# Patient Record
Sex: Female | Born: 1985 | Race: Black or African American | Hispanic: No | Marital: Single | Smoking: Never smoker
Health system: Southern US, Community
[De-identification: ages and names within clinical notes are randomized; demographics above are authoritative.]

## PROBLEM LIST (undated history)

## (undated) DIAGNOSIS — F32A Depression, unspecified: Secondary | ICD-10-CM

## (undated) DIAGNOSIS — G932 Benign intracranial hypertension: Secondary | ICD-10-CM

## (undated) DIAGNOSIS — F419 Anxiety disorder, unspecified: Secondary | ICD-10-CM

## (undated) DIAGNOSIS — T7840XA Allergy, unspecified, initial encounter: Secondary | ICD-10-CM

## (undated) HISTORY — DX: Allergy, unspecified, initial encounter: T78.40XA

## (undated) HISTORY — DX: Anxiety disorder, unspecified: F41.9

## (undated) HISTORY — PX: CHOLECYSTECTOMY: SHX55

## (undated) HISTORY — DX: Depression, unspecified: F32.A

---

## 2020-02-26 ENCOUNTER — Emergency Department (HOSPITAL_COMMUNITY): Payer: PRIVATE HEALTH INSURANCE

## 2020-02-26 ENCOUNTER — Emergency Department (HOSPITAL_COMMUNITY)
Admission: EM | Admit: 2020-02-26 | Discharge: 2020-02-27 | Disposition: A | Discharge status: Home / Self Care | Payer: PRIVATE HEALTH INSURANCE | Attending: Emergency Medicine | Admitting: Emergency Medicine

## 2020-02-26 ENCOUNTER — Encounter (HOSPITAL_COMMUNITY): Payer: Self-pay

## 2020-02-26 DIAGNOSIS — R21 Rash and other nonspecific skin eruption: Secondary | ICD-10-CM | POA: Insufficient documentation

## 2020-02-26 DIAGNOSIS — L539 Erythematous condition, unspecified: Secondary | ICD-10-CM | POA: Insufficient documentation

## 2020-02-26 DIAGNOSIS — Z20822 Contact with and (suspected) exposure to covid-19: Secondary | ICD-10-CM | POA: Insufficient documentation

## 2020-02-26 DIAGNOSIS — M308 Other conditions related to polyarteritis nodosa: Secondary | ICD-10-CM | POA: Insufficient documentation

## 2020-02-26 DIAGNOSIS — M2550 Pain in unspecified joint: Secondary | ICD-10-CM

## 2020-02-26 DIAGNOSIS — R509 Fever, unspecified: Secondary | ICD-10-CM | POA: Insufficient documentation

## 2020-02-26 LAB — RESPIRATORY PANEL BY RT PCR (FLU A&B, COVID)
Influenza A by PCR: NEGATIVE
Influenza B by PCR: NEGATIVE
SARS Coronavirus 2 by RT PCR: NEGATIVE

## 2020-02-26 LAB — GROUP A STREP BY PCR: Group A Strep by PCR: NOT DETECTED

## 2020-02-26 IMAGING — DX DG CHEST 2V
2 series · 2 of 2 positions shown · non-contrast
Comparison: None.

CLINICAL DATA: Fever and right-sided chest pain. Sore throat. Body
aches. COVID negative and fully vaccinated.

EXAM:
CHEST - 2 VIEW

[chest pa]
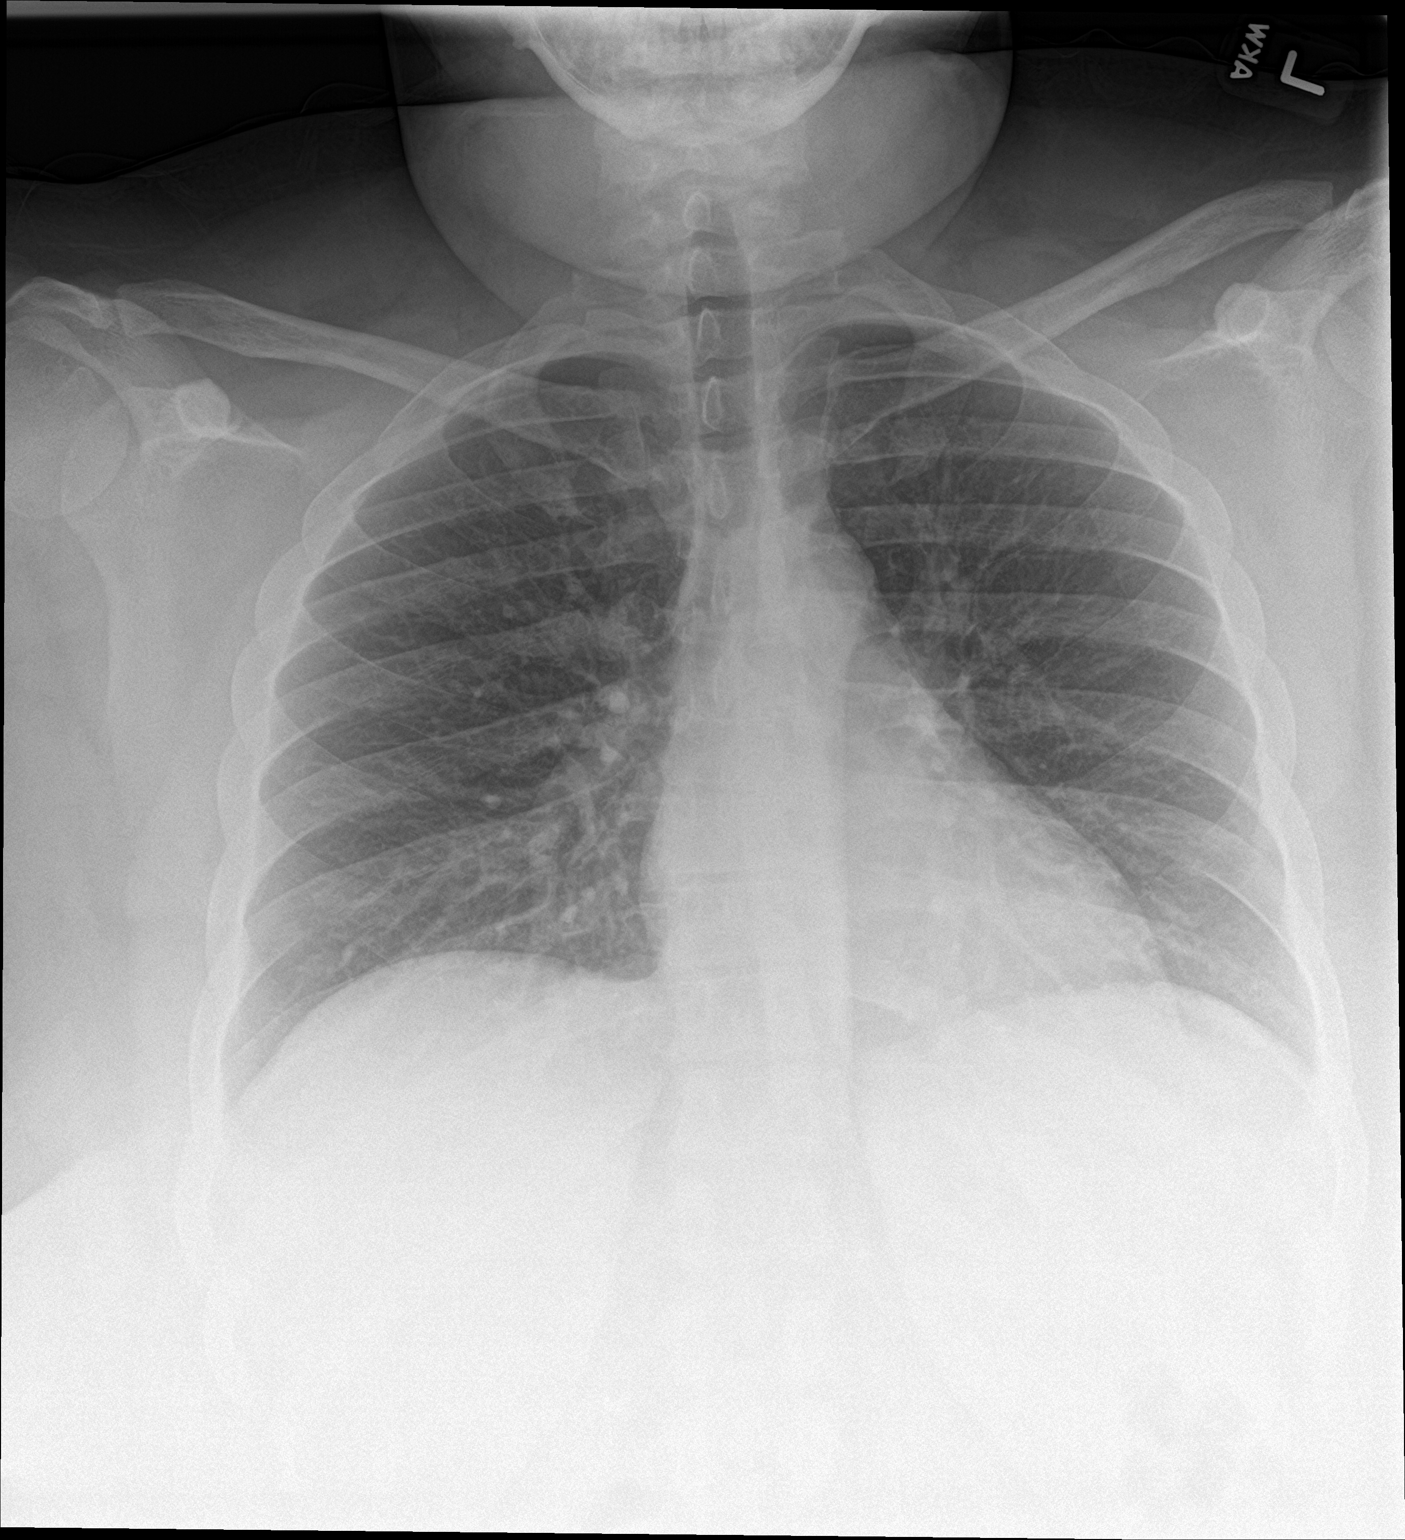

[chest lat]
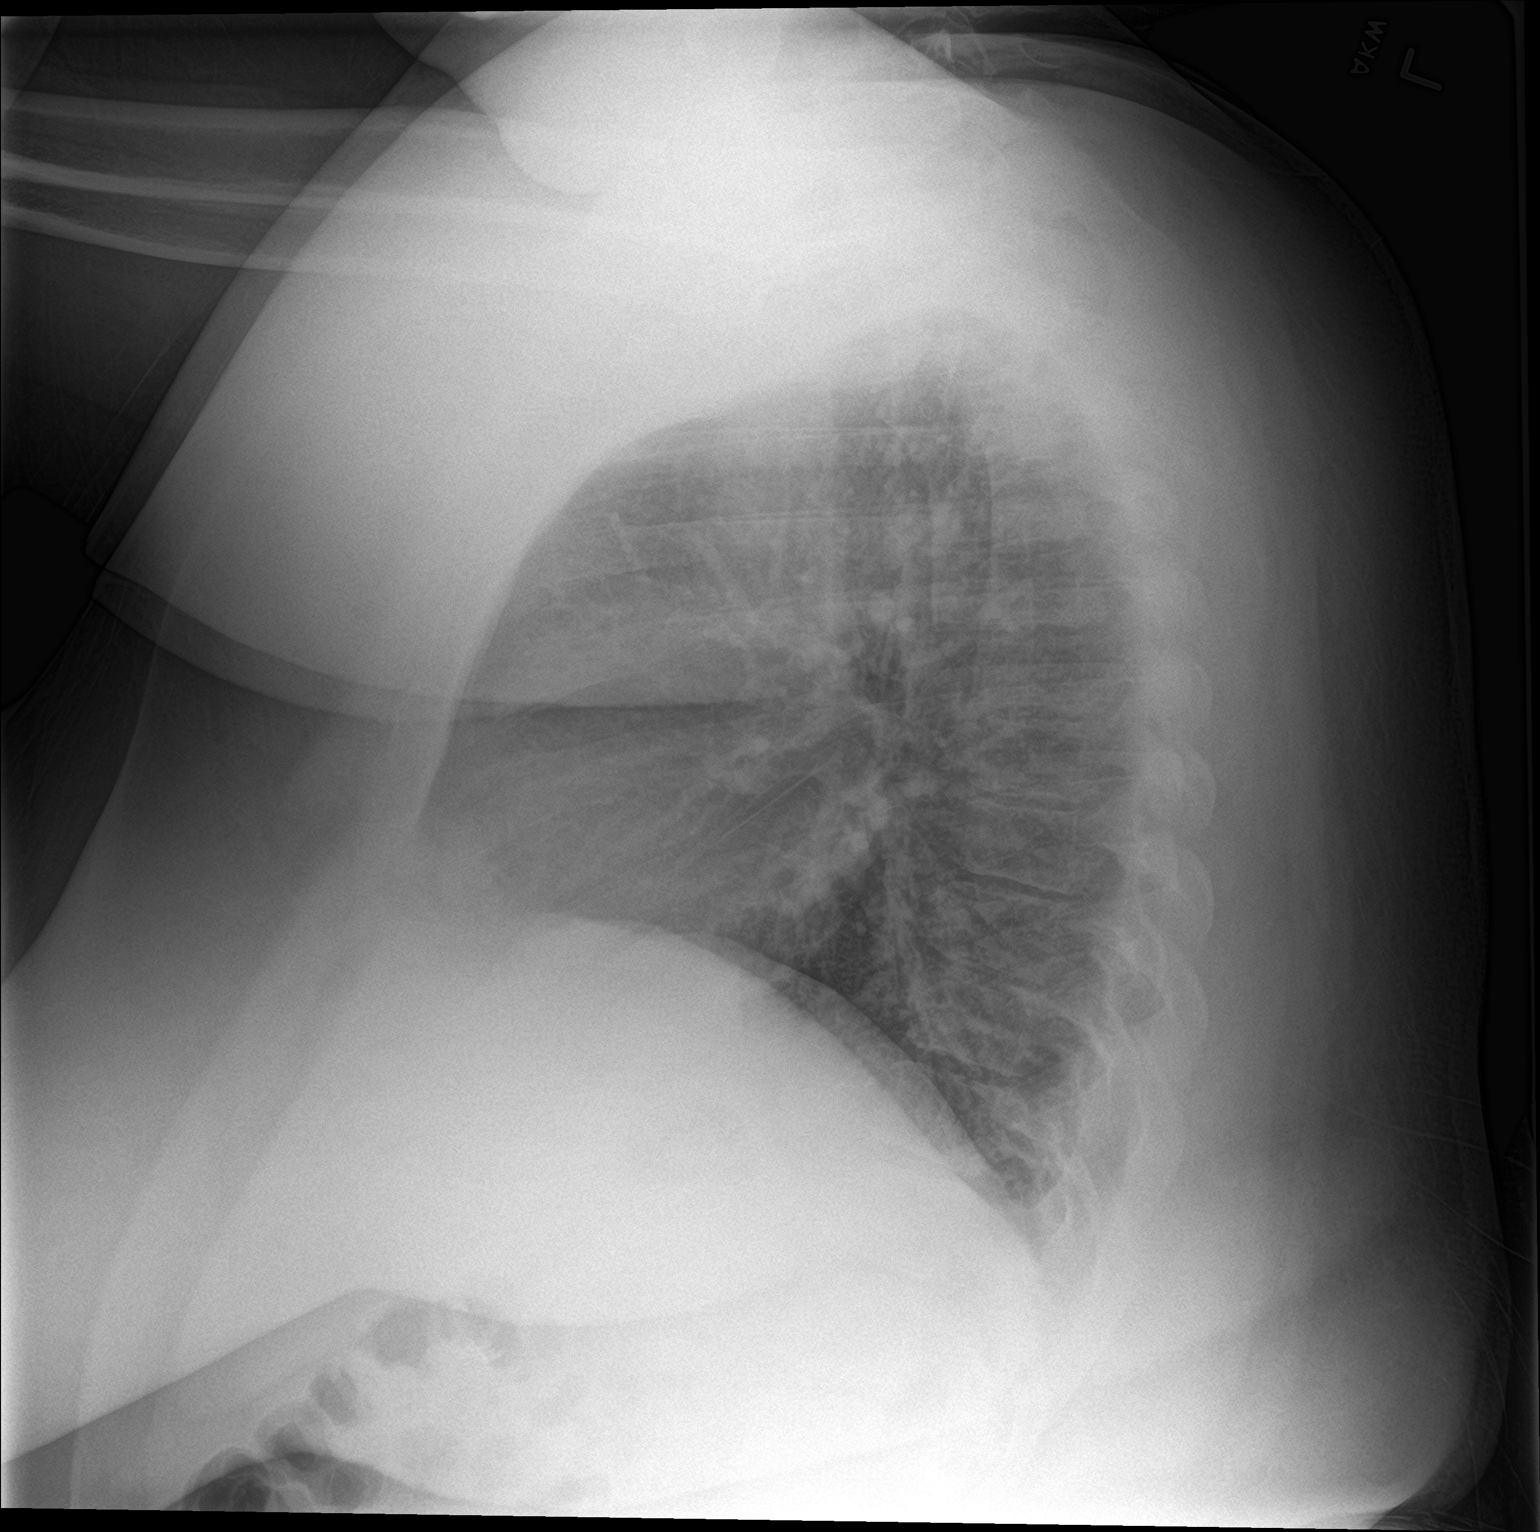

[2 of 2 positions shown; findings below may reference images not displayed]

FINDINGS: The cardiomediastinal contours are normal. Mild bronchial
thickening. Pulmonary vasculature is normal. No consolidation,
pleural effusion, or pneumothorax. No acute osseous abnormalities
are seen.
IMPRESSION: Mild bronchial thickening. No pneumonia.

## 2020-02-26 NOTE — ED Triage Notes (Signed)
Pt reports headache, sore throat, fevers, body aches, for 3 days, pt is vaccinated

## 2020-02-26 NOTE — ED Provider Notes (Signed)
[facility] EMERGENCY DEPARTMENT Provider Note   CSN: [id number] Arrival date & time: 02/26/20  6767     History No chief complaint on file.   Crystal Melton is a 34 y.o. female with a history of recurrent tonsillitis and rosacea who presents to the emergency department with a chief complaint of polyarthralgias.  The patient reports that she developed an intermittent posterior headache approximately 4 to 5 days ago.  Headache has been throbbing and coming and going since onset.  Does not radiate.  No history of similar.  Approximately 3 days ago, she developed severe pain in her bilateral elbows and shoulders followed by bilateral ear pain and pressure.  She reports that the ear pain and pressure felt similar to previous infections and usually preceded a sore throat, which is what happened in this instance.  However, she does not has a history of previous arthralgias.  She reports that sore throat has persisted since onset and she has been having worsening dysphagia.  States that she feels as if she can barely swallow and has had some intermittent drooling.  Sore throat is worse on the right side.  After her sore throat began to worsen, she reports that joint pain spread from her elbows and shoulders to her bilateral hips, knees, and said tops of her bilateral feet.  She is also having some intermittent pain in the small joints of her bilateral hands.  She has had intermittent numbness in her bilateral hands.  No known aggravating or alleviating factors.  She denies weakness.  As the arthralgias spread, she also developed an erythematous rash on her hands that spread up her arms into her trunk.  She does not recall that she had a rash on her face, but did not feel as if her face was swollen and more edematous than when she typically has an outbreak from rosacea.  She also noticed some itching in the corner of her bilateral eyes, which is since resolved.  She thought this may be  due to taking Mucinex, but reports that she has never had an allergic reaction to this medication.  However, she since discontinued the medication.  Two nights ago, she developed a fever.  T-max 103 at home earlier today.  She denies any treatment for her fever over the last 24 hours.  After onset of fever, she developed some achy, constant, right-sided, nonradiating chest pain.    She reports that as her joint pain is worse and that she can barely lift her shoulders and is unable to fully range her elbows.  She reports that it took her approximately 30 minutes to walk up the stairs today due to the pain in the joints in her leg.  She reports that she is also having significant pain and stiffness with moving her neck and feels as if she has to turn her body.  She denies confusion, abdominal pain, nausea, vomiting, diarrhea, back pain, shortness of breath, cough, nasal congestion, rhinorrhea, otalgia, vaginal bleeding, vaginal discharge, dysuria, hematuria, flank pain, tinnitus, dizziness, lightheadedness.  She has a history of recurrent tonsillitis and has been tested multiple times for streptococcal pharyngitis, but reports that she has never tested positive.  She reports that she has spoken with ENT about having a tonsillectomy, but has yet to schedule the procedure.  She is fully vaccinated against COVID-19.  No known sick contacts.  No recent travel outside of the country.  No recent camping or beach trips.  No known tick bites  or mosquito bites.  No new contact with animals.  She does not have any concerns for STIs, but is sexually active.  She has no concerns for pregnancy.  Patient recently relocated to the area from [place].  The history is provided by the patient and medical records. No language interpreter was used.       History reviewed. No pertinent past medical history.  There are no problems to display for this patient.   Past Surgical History:  Procedure Laterality Date  .  CHOLECYSTECTOMY       OB History   No obstetric history on file.     No family history on file.  Social History   Tobacco Use  . Smoking status: Not on file  Substance Use Topics  . Alcohol use: Not on file  . Drug use: Not on file    Home Medications Prior to Admission medications   Medication Sig Start Date End Date Taking? Authorizing Provider  guaiFENesin (MUCINEX PO) Take 20 mLs by mouth daily as needed (For cold symptoms).    Yes [provider]  ibuprofen (ADVIL) 200 MG tablet Take 200 mg by mouth every 6 (six) hours as needed for moderate pain.   Yes [provider]  doxycycline (VIBRAMYCIN) 50 MG/5ML SYRP Take 10 mLs (100 mg total) by mouth 2 (two) times daily for 7 days. 02/27/20 03/05/20  Judge Duque A, PA-C    Allergies    Penicillins  Review of Systems   Review of Systems  Constitutional: Positive for chills and fever. Negative for activity change and diaphoresis.  HENT: Positive for ear pain, facial swelling, sore throat and trouble swallowing. Negative for congestion, rhinorrhea, sinus pressure, sinus pain, tinnitus and voice change.   Eyes: Negative for visual disturbance.  Respiratory: Negative for cough, shortness of breath and wheezing.   Cardiovascular: Positive for chest pain. Negative for palpitations.  Gastrointestinal: Negative for abdominal pain, constipation, diarrhea, nausea and vomiting.  Endocrine: Negative for polyuria.  Genitourinary: Negative for decreased urine volume, dysuria, flank pain, frequency, hematuria, urgency and vaginal discharge.  Musculoskeletal: Positive for arthralgias, gait problem, myalgias, neck pain and neck stiffness. Negative for back pain and joint swelling.  Skin: Positive for rash. Negative for wound.  Allergic/Immunologic: Negative for immunocompromised state.  Neurological: Positive for numbness (bilateral hands) and headaches. Negative for dizziness, syncope and weakness.   Psychiatric/Behavioral: Negative for confusion.    Physical Exam Updated Vital Signs BP 112/75 (BP Location: Left Wrist)   Pulse 94   Temp 98.6 F (37 C) (Oral)   Resp 20   SpO2 96%   Physical Exam Vitals and nursing note reviewed.  Constitutional:      General: She is not in acute distress.    Appearance: She is not ill-appearing, toxic-appearing or diaphoretic.  HENT:     Head: Normocephalic.     Right Ear: Hearing, ear canal and external ear normal. A middle ear effusion is present. No mastoid tenderness. Tympanic membrane is not injected, erythematous, retracted or bulging.     Left Ear: Hearing, ear canal and external ear normal. A middle ear effusion is present. No mastoid tenderness. Tympanic membrane is not injected, erythematous, retracted or bulging.     Nose: Nose normal.     Right Sinus: No maxillary sinus tenderness or frontal sinus tenderness.     Left Sinus: No maxillary sinus tenderness or frontal sinus tenderness.     Mouth/Throat:     Mouth: Mucous membranes are moist.  Pharynx: Oropharynx is clear. Uvula midline. Posterior oropharyngeal erythema present. No oropharyngeal exudate or uvula swelling.     Tonsils: No tonsillar exudate or tonsillar abscesses. 3+ on the right. 3+ on the left.  Eyes:     Conjunctiva/sclera: Conjunctivae normal.  Cardiovascular:     Rate and Rhythm: Normal rate and regular rhythm.     Pulses: Normal pulses.     Heart sounds: Normal heart sounds. No murmur heard.  No friction rub. No gallop.   Pulmonary:     Effort: Pulmonary effort is normal. No respiratory distress.     Breath sounds: No stridor. No wheezing, rhonchi or rales.     Comments: Lungs are clear to auscultation bilaterally.  No increased work of breathing.  No reproducible tenderness to palpation to the chest wall.  No crepitus or step-offs. Chest:     Chest wall: No tenderness.  Abdominal:     General: There is no distension.     Palpations: Abdomen is soft.  There is no mass.     Tenderness: There is no abdominal tenderness. There is no right CVA tenderness, left CVA tenderness, guarding or rebound.     Hernia: No hernia is present.  Musculoskeletal:     Cervical back: Neck supple.     Right lower leg: No edema.     Left lower leg: No edema.     Comments: No tenderness to palpation to the spinous processes of the cervical, thoracic, or lumbar spinous processes or bilateral paraspinal muscles.  Decreased range of motion of the cervical spine due to pain.   She is tender to palpation to the bilateral shoulders, elbows, hips, knees, and to the dorsum of the bilateral feet.  Bilateral ankles are otherwise unremarkable.  Bilateral wrist are nontender.  She is only able to AB duct her bilateral shoulders to approximately 90 degrees secondary to pain.  Range of motion of the bilateral elbows is also decreased secondary to pain.    There is no overlying erythema, edema to any of the joints.  Skin:    General: Skin is warm.     Capillary Refill: Capillary refill takes less than 2 seconds.     Findings: Rash present.     Comments: Erythematous maculopapular rash noted to the bilateral upper arms.  No pustules, vesicles, petechiae, or purpura.  Exam is limited as patient is in a hallway bed.  Neurological:     Mental Status: She is alert.  Psychiatric:        Behavior: Behavior normal.     ED Results / Procedures / Treatments   Labs (all labs ordered are listed, but only abnormal results are displayed) Labs Reviewed  CBC WITH DIFFERENTIAL/PLATELET - Abnormal; Notable for the following components:      Result Value   WBC 14.4 (*)    Hemoglobin 11.7 (*)    Neutro Abs 10.6 (*)    Monocytes Absolute 1.1 (*)    All other components within normal limits  COMPREHENSIVE METABOLIC PANEL - Abnormal; Notable for the following components:   Potassium 3.3 (*)    Glucose, Bld 102 (*)    BUN <5 (*)    All other components within normal limits   C-REACTIVE PROTEIN - Abnormal; Notable for the following components:   CRP 13.3 (*)    All other components within normal limits  SEDIMENTATION RATE - Abnormal; Notable for the following components:   Sed Rate 38 (*)    All other components within normal  limits  RESPIRATORY PANEL BY RT PCR (FLU A&B, COVID)  GROUP A STREP BY PCR  RESPIRATORY PANEL BY PCR  CULTURE, BLOOD (ROUTINE X 2)  CULTURE, BLOOD (ROUTINE X 2)  MONONUCLEOSIS SCREEN  RAPID HIV SCREEN (HIV 1/2 AB+AG)  CK  RPR  URINALYSIS, ROUTINE W REFLEX MICROSCOPIC  ROCKY MTN SPOTTED FVR ABS PNL(IGG+IGM)  B. BURGDORFI ANTIBODIES    EKG None  Radiology DG Chest 2 View  Result Date: 02/26/2020 CLINICAL DATA:  Fever and right-sided chest pain. Sore throat. Body aches. COVID negative and fully vaccinated. EXAM: CHEST - 2 VIEW COMPARISON:  None. FINDINGS: The cardiomediastinal contours are normal. Mild bronchial thickening. Pulmonary vasculature is normal. No consolidation, pleural effusion, or pneumothorax. No acute osseous abnormalities are seen. IMPRESSION: Mild bronchial thickening. No pneumonia. Electronically Signed   By: Keith Rake M.D.   On: 02/26/2020 23:49    Procedures Procedures (including critical care time)  Medications Ordered in ED Medications  prochlorperazine (COMPAZINE) injection 10 mg (10 mg Intravenous Given 02/27/20 0252)  diphenhydrAMINE (BENADRYL) injection 12.5 mg (12.5 mg Intravenous Given 02/27/20 0253)  ketorolac (TORADOL) 30 MG/ML injection 30 mg (30 mg Intravenous Given 02/27/20 0252)  doxycycline (VIBRAMYCIN) 50 MG/5ML syrup 100 mg (100 mg Oral Given 02/27/20 0434)    ED Course  I have reviewed the triage vital signs and the nursing notes.  Pertinent labs & imaging results that were available during my care of the patient were reviewed by me and considered in my medical decision making (see chart for details).  Clinical Course as of Feb 27 828  Thu Feb 27, 2020  0345 Patient  recheck.  She reports that her symptoms have significantly improved.  Headache has resolved.  She is now able to fully range her cervical spine.  She has no meningismus.  We will plan to fluid challenge the patient with oral doxycycline and discharged home with the same.  Patient and mother are agreeable with plan at this time.   [MM]    Clinical Course User Index [MM] Tabathia Knoche, Laymond Purser, PA-C   MDM Rules/Calculators/A&P                          34 year old female with with a history of recurrent tonsillitis and rosacea presenting with fever, polyarthralgias, rash, headache, right-sided chest pain, bilateral ear pain and pressure, sore throat, and dysphagia that has developed over the last 5 days.   This patient presents to the ED for concern of  this involves an extensive number of treatment options, and is a complaint that carries with it a high risk of complications and morbidity.  The differential diagnosis includes secondary or tertiary syphilis, disseminated gonococcal arthritis, endocarditis, SLE, rheumatic fever, cytomegalovirus, HIV, tickborne illness, leukemia, meningitis, drug reaction, or vasculitis.      Lab Tests:    I ordered, reviewed, and interpreted labs, which included  CBC, metabolic panel, ESR, CRP, blood cultures, viral respiratory panel, [place] spotted fever and Lyme disease PCR test, RPR, HIV test, strep PCR, and COVID-19 test that showed elevated sedimentation rate and CRP.  She had a leukocytosis of 14.  Labs suggest inflammation versus infection.  Loss could be secondary to inflammation given that these are all acute phase reactants.   Medicines ordered:    I ordered Toradol and oral doxycycline.   Imaging Studies ordered:    I ordered imaging studies which included  chest x-ray.  I independently visualized and interpreted imaging which  showed  mild bronchial thickening.  No evidence of pneumonia.  No cardiomegaly, pleural effusion, or pneumothorax.    Additional history obtained:    Previous records obtained and reviewed   Reevaluation:   After the interventions stated above, I reevaluated the patient and found improved.  Significant improvement in range of motion of all joints.  No meningismus.  She is remained afebrile for more than 11 hours since she arrived in the ER.  With 5 days of polyarthralgias, fever, rash, URI symptoms, there is an extensive differential diagnosis.  Although patient does not have any documented history of recent tick bites, she has been working in her garden.  I think it is reasonable to cover for [facility] spotted fever and she has been started on doxycycline in the ER.  I have a lower suspicion for disseminated gonorrhea given her sexual history.  She does not have any risk factors for endocarditis.  Viral respiratory panel was unremarkable.  COVID-19 test was unremarkable.  Patient did initially have headache, but after Toradol this resolved.  She has no meningismus.  I have a low suspicion for meningitis or encephalitis.  Labs were not suggestive of neoplastic etiology.  Lower suspicion for COVID-19.  I also doubt septic joint.  Her presentation does not seem consistent with a drug eruption or reaction.  Blood cultures are pending.  The patient was seen and independently evaluated by Dr. Christy Gentles, attending physician.  She is nontoxic and well-appearing. She is new to the area and a transition of care consult has been placed to help assist the patient with getting established with a PCP.  I do not see any indication for referral to ID at this time.  The patient was given very strict return precautions to the emergency department.  She is hemodynamically stable and in no acute distress.  Safe for discharge to home with close outpatient follow-up at this time.    Final Clinical Impression(s) / ED Diagnoses Final diagnoses:  Polyarthralgia  Fever, unspecified fever cause  Rash and nonspecific skin eruption    Rx  / DC Orders ED Discharge Orders         Ordered    doxycycline (VIBRAMYCIN) 50 MG/5ML SYRP  2 times daily        02/27/20 0513           Joline Maxcy A, PA-C 02/27/20 5183    Ripley Fraise, MD 02/27/20 2324

## 2020-02-26 NOTE — ED Notes (Signed)
Patient transported to X-ray 

## 2020-02-27 LAB — RESPIRATORY PANEL BY PCR

## 2020-02-27 LAB — CBC WITH DIFFERENTIAL/PLATELET
Abs Immature Granulocytes: 0.07 10*3/uL (ref 0.00–0.07)
Basophils Absolute: 0.1 10*3/uL (ref 0.0–0.1)
Basophils Relative: 1 %
Eosinophils Absolute: 0.1 10*3/uL (ref 0.0–0.5)
Eosinophils Relative: 1 %
HCT: 37.8 % (ref 36.0–46.0)
Hemoglobin: 11.7 g/dL — ABNORMAL LOW (ref 12.0–15.0)
Immature Granulocytes: 1 %
Lymphocytes Relative: 17 %
Lymphs Abs: 2.5 10*3/uL (ref 0.7–4.0)
MCH: 28.1 pg (ref 26.0–34.0)
MCHC: 31 g/dL (ref 30.0–36.0)
MCV: 90.6 fL (ref 80.0–100.0)
Monocytes Absolute: 1.1 10*3/uL — ABNORMAL HIGH (ref 0.1–1.0)
Monocytes Relative: 8 %
Neutro Abs: 10.6 10*3/uL — ABNORMAL HIGH (ref 1.7–7.7)
Neutrophils Relative %: 72 %
Platelets: 262 10*3/uL (ref 150–400)
RBC: 4.17 MIL/uL (ref 3.87–5.11)
RDW: 12.4 % (ref 11.5–15.5)
WBC: 14.4 10*3/uL — ABNORMAL HIGH (ref 4.0–10.5)
nRBC: 0 % (ref 0.0–0.2)

## 2020-02-27 LAB — COMPREHENSIVE METABOLIC PANEL
ALT: 24 U/L (ref 0–44)
AST: 19 U/L (ref 15–41)
Albumin: 3.5 g/dL (ref 3.5–5.0)
Alkaline Phosphatase: 57 U/L (ref 38–126)
Anion gap: 10 (ref 5–15)
BUN: <5 mg/dL — ABNORMAL LOW (ref 6–20)
CO2: 25 mmol/L (ref 22–32)
Calcium: 8.9 mg/dL (ref 8.9–10.3)
Chloride: 103 mmol/L (ref 98–111)
Creatinine, Ser: 0.96 mg/dL (ref 0.44–1.00)
GFR, Estimated: >60 mL/min (ref >60)
Glucose, Bld: 102 mg/dL — ABNORMAL HIGH (ref 70–99)
Potassium: 3.3 mmol/L — ABNORMAL LOW (ref 3.5–5.1)
Sodium: 138 mmol/L (ref 135–145)
Total Bilirubin: 1 mg/dL (ref 0.3–1.2)
Total Protein: 7.6 g/dL (ref 6.5–8.1)

## 2020-02-27 LAB — RAPID HIV SCREEN (HIV 1/2 AB+AG)
HIV 1/2 Antibodies: NONREACTIVE
HIV-1 P24 Antigen - HIV24: NONREACTIVE

## 2020-02-27 LAB — RPR: RPR Ser Ql: NONREACTIVE

## 2020-02-27 LAB — SEDIMENTATION RATE: Sed Rate: 38 mm/hr — ABNORMAL HIGH (ref 0–22)

## 2020-02-27 LAB — C-REACTIVE PROTEIN: CRP: 13.3 mg/dL — ABNORMAL HIGH (ref <1.0)

## 2020-02-27 LAB — CK: Total CK: 135 U/L (ref 38–234)

## 2020-02-27 LAB — MONONUCLEOSIS SCREEN: Mono Screen: NEGATIVE

## 2020-02-27 MED ORDER — DIPHENHYDRAMINE HCL 50 MG/ML IJ SOLN
12.5000 mg | Freq: Once | INTRAMUSCULAR | Status: AC
  Administered 2020-02-27: 12.5 mg via INTRAVENOUS
  Filled 2020-02-27 (×2): qty 1

## 2020-02-27 MED ORDER — DOXYCYCLINE CALCIUM 50 MG/5ML PO SYRP
100.0000 mg | ORAL_SOLUTION | Freq: Two times a day (BID) | ORAL | Status: AC
  Administered 2020-02-27: 100 mg via ORAL
  Filled 2020-02-27: qty 10

## 2020-02-27 MED ORDER — KETOROLAC TROMETHAMINE 30 MG/ML IJ SOLN
30.0000 mg | Freq: Once | INTRAMUSCULAR | Status: AC
  Administered 2020-02-27: 30 mg via INTRAVENOUS
  Filled 2020-02-27: qty 1

## 2020-02-27 MED ORDER — DOXYCYCLINE CALCIUM 50 MG/5ML PO SYRP: 100.0000 mg | ORAL_SOLUTION | Freq: Two times a day (BID) | ORAL | 0 refills | Status: AC

## 2020-02-27 MED ORDER — PROCHLORPERAZINE EDISYLATE 10 MG/2ML IJ SOLN
10.0000 mg | Freq: Once | INTRAMUSCULAR | Status: AC
  Administered 2020-02-27: 10 mg via INTRAVENOUS
  Filled 2020-02-27 (×2): qty 2

## 2020-02-27 MED ORDER — FENTANYL CITRATE (PF) 100 MCG/2ML IJ SOLN: 100.0000 ug | Freq: Once | INTRAMUSCULAR | Status: DC

## 2020-02-27 NOTE — ED Provider Notes (Signed)
Patient seen/examined in the Emergency Department in conjunction with Advanced Practice Provider McDonald Patient reports HA/ST/fever/chills/myalgias/rash.  No recent travel . Unknown tick exposure Exam : awake/alet, no distress, resting comfortably, no meningeal signs, no stridor/drooling Plan: pt will be given meds for pain and reassess She will likely need empiric treatment for tick borne illness Pt is otherwise nontoxic in appearance     Zadie Rhine, MD 02/27/20 0240

## 2020-02-27 NOTE — Discharge Instructions (Addendum)
Thank you for allowing me to care for you today in the Emergency Department.   You have multiple tests that are still pending in the emergency department that may take several days to resolve.  However, you significantly improved after treatment and overall, your physical exam and vital signs are reassuring.  You may receive a call from someone at the hospital if any of the pending tests have abnormal results.  Since you are new to the area, please call the number on your discharge paperwork to get established with primary care.  You can also check on your medical insurance website page to see if there are any clinicians in the area that are actively accepting patients.  I did also consult our transition of care team to see if they are able to provide you with any additional assistance.  They may call you at the number you gave to registration today to follow-up.  Take 650 mg of Tylenol or 600 mg of ibuprofen with food every 6 hours for pain or fever.  You can alternate between these 2 medications every 3 hours if your pain returns.  For instance, you can take Tylenol at noon, followed by a dose of ibuprofen at 3, followed by second dose of Tylenol and 6.  These do also come in a liquid form over-the-counter.  Drinking hot and cold liquids will also help with your sore throat.  Make sure that you are drinking plenty liquids to avoid dehydration.  Return to the emergency department if you become unable to walk, if you develop significant shortness of breath, if you pass out, if you develop confusion, if your joints get red, hot to the touch, and very swollen, if you become unable to walk, or if you develop other new, concerning symptoms.

## 2020-02-28 LAB — ROCKY MTN SPOTTED FVR ABS PNL(IGG+IGM)
RMSF IgG: NEGATIVE
RMSF IgM: 1.02 index — ABNORMAL HIGH (ref 0.00–0.89)

## 2020-02-28 LAB — B. BURGDORFI ANTIBODIES: B burgdorferi Ab IgG+IgM: <0.91 {ISR} (ref 0.00–0.90)

## 2020-03-03 LAB — CULTURE, BLOOD (ROUTINE X 2)
Culture: NO GROWTH
Special Requests: ADEQUATE

## 2020-03-18 LAB — HM PAP SMEAR

## 2020-09-02 ENCOUNTER — Ambulatory Visit
Admission: RE | Admit: 2020-09-02 | Discharge: 2020-09-02 | Disposition: A | Discharge status: Home / Self Care | Payer: 59 | Source: Ambulatory Visit | Attending: Emergency Medicine | Admitting: Emergency Medicine

## 2020-09-02 ENCOUNTER — Other Ambulatory Visit: Payer: Self-pay

## 2020-09-02 VITALS — BP 142/88 | HR 79 | Temp 98.5°F | Resp 16

## 2020-09-02 DIAGNOSIS — H18891 Other specified disorders of cornea, right eye: Secondary | ICD-10-CM

## 2020-09-02 DIAGNOSIS — H5711 Ocular pain, right eye: Secondary | ICD-10-CM

## 2020-09-02 MED ORDER — POLYMYXIN B-TRIMETHOPRIM 10000-0.1 UNIT/ML-% OP SOLN: 1.0000 [drp] | Freq: Four times a day (QID) | OPHTHALMIC | 0 refills | Status: AC

## 2020-09-02 NOTE — ED Triage Notes (Signed)
Pt got grass clipping in her RT eye on Wednesday, pt flushed her eye out several times but it continued to feel irritated.  On Friday pt began to experience blurry vision in that eye.

## 2020-09-02 NOTE — ED Provider Notes (Signed)
[facility]   [id number] 09/02/20 Arrival Time: 1241  CC: RT eye pain  SUBJECTIVE:  Mayrin Schmuck is a 35 y.o. female who presents with complaint of RT eye pain, redness, irritation, and blurriness x 1 week.  Reports getting grass clipping in eye.  Rinsing daily with saline solution, without relief.  Symptoms are made somewhat worse with light.  Denies similar symptoms in the past.  Denies fever, chills, nausea, vomiting, eye pain, painful eye movements, discharge, itching, double vision, FB sensation, periorbital erythema.     Denies contact lens use.    ROS: As per HPI.  All other pertinent ROS negative.     History reviewed. No pertinent past medical history. Past Surgical History:  Procedure Laterality Date  . CHOLECYSTECTOMY     Allergies  Allergen Reactions  . Penicillins Swelling   No current facility-administered medications on file prior to encounter.   Current Outpatient Medications on File Prior to Encounter  Medication Sig Dispense Refill  . guaiFENesin (MUCINEX PO) Take 20 mLs by mouth daily as needed (For cold symptoms).     Marland Kitchen ibuprofen (ADVIL) 200 MG tablet Take 200 mg by mouth every 6 (six) hours as needed for moderate pain.     Social History   Socioeconomic History  . Marital status: Single    Spouse name: Not on file  . Number of children: Not on file  . Years of education: Not on file  . Highest education level: Not on file  Occupational History  . Not on file  Tobacco Use  . Smoking status: Never Smoker  . Smokeless tobacco: Never Used  Substance and Sexual Activity  . Alcohol use: Yes    Comment: occ  . Drug use: Never  . Sexual activity: Not on file  Other Topics Concern  . Not on file  Social History Narrative  . Not on file   Social Determinants of Health   Financial Resource Strain: Not on file  Food Insecurity: Not on file  Transportation Needs: Not on file  Physical Activity: Not on file  Stress: Not on file  Social  Connections: Not on file  Intimate Partner Violence: Not on file   No family history on file.  OBJECTIVE:    Visual Acuity  Right Eye Distance: pt states all the letters are distored with RT eye  Left Eye Distance: 20/16 Bilateral Distance: 20/16  Vitals:   09/02/20 1301  BP: (!) 142/88  Pulse: 79  Resp: 16  Temp: 98.5 F (36.9 C)  TempSrc: Oral  SpO2: 97%    General appearance: alert; no distress Eyes: RT Trace conjunctival erythema in 7 o'clock position. PERRL; EOMI without discomfort;  no obvious drainage; no obvious fluorescein uptake  Neck: supple Lungs: clear to auscultation bilaterally Heart: regular rate and rhythm Skin: warm and dry Psychological: alert and cooperative; normal mood and affect   ASSESSMENT & PLAN:  1. Eye pain, right   2. Corneal irritation of right eye     Meds ordered this encounter  Medications  . trimethoprim-polymyxin b (POLYTRIM) ophthalmic solution    Sig: Place 1 drop into the right eye every 6 (six) hours for 7 days.    Dispense:  10 mL    Refill:  0    Order Specific Question:   Supervising Provider    Answer:   Eustace Moore [[id number]]   Will cover for eye infection given gross conjunctiva erythema and HPI Use polytrim eye drops as prescribed and to  completion Use OTC systane or genteal gel eye drops at night as needed for symptomatic relief Use OTC ibuprofen or tylenol as needed for pain relief Return here or follow up with ophthamolgy if symptoms persists or worsen such as fever, chills, redness, swelling, eye pain, painful eye movements, vision changes, etc...   Reviewed expectations re: course of current medical issues. Questions answered. Outlined signs and symptoms indicating need for more acute intervention. Patient verbalized understanding. After Visit Summary given.   Rennis Harding, PA-C 09/02/20 1333

## 2020-09-02 NOTE — Discharge Instructions (Signed)
Use polytrim eye drops as prescribed and to completion Use OTC systane or genteal gel eye drops at night as needed for symptomatic relief Use OTC ibuprofen or tylenol as needed for pain relief Return here or follow up with ophthamolgy if symptoms persists or worsen such as fever, chills, redness, swelling, eye pain, painful eye movements, vision changes, etc... 

## 2020-09-15 ENCOUNTER — Other Ambulatory Visit: Payer: Self-pay

## 2020-09-15 ENCOUNTER — Encounter (HOSPITAL_COMMUNITY): Payer: Self-pay

## 2020-09-15 ENCOUNTER — Observation Stay (HOSPITAL_COMMUNITY)
Admission: EM | Admit: 2020-09-15 | Discharge: 2020-09-17 | Disposition: A | Discharge status: Home / Self Care | Payer: 59 | Attending: Internal Medicine | Admitting: Internal Medicine

## 2020-09-15 ENCOUNTER — Emergency Department (HOSPITAL_COMMUNITY): Payer: 59

## 2020-09-15 DIAGNOSIS — E66813 Obesity, class 3: Secondary | ICD-10-CM

## 2020-09-15 DIAGNOSIS — H538 Other visual disturbances: Secondary | ICD-10-CM | POA: Diagnosis present

## 2020-09-15 DIAGNOSIS — E669 Obesity, unspecified: Secondary | ICD-10-CM | POA: Diagnosis not present

## 2020-09-15 DIAGNOSIS — G932 Benign intracranial hypertension: Principal | ICD-10-CM

## 2020-09-15 DIAGNOSIS — Z20822 Contact with and (suspected) exposure to covid-19: Secondary | ICD-10-CM | POA: Diagnosis not present

## 2020-09-15 DIAGNOSIS — Z6841 Body Mass Index (BMI) 40.0 and over, adult: Secondary | ICD-10-CM | POA: Insufficient documentation

## 2020-09-15 DIAGNOSIS — I10 Essential (primary) hypertension: Secondary | ICD-10-CM | POA: Diagnosis not present

## 2020-09-15 DIAGNOSIS — H547 Unspecified visual loss: Secondary | ICD-10-CM

## 2020-09-15 LAB — I-STAT CHEM 8, ED
BUN: 9 mg/dL (ref 6–20)
Calcium, Ion: 1.26 mmol/L (ref 1.15–1.40)
Chloride: 105 mmol/L (ref 98–111)
Creatinine, Ser: 0.8 mg/dL (ref 0.44–1.00)
Glucose, Bld: 77 mg/dL (ref 70–99)
HCT: 44 % (ref 36.0–46.0)
Hemoglobin: 15 g/dL (ref 12.0–15.0)
Potassium: 3.7 mmol/L (ref 3.5–5.1)
Sodium: 142 mmol/L (ref 135–145)
TCO2: 23 mmol/L (ref 22–32)

## 2020-09-15 LAB — CBC WITH DIFFERENTIAL/PLATELET
Abs Immature Granulocytes: 0.02 10*3/uL (ref 0.00–0.07)
Basophils Absolute: 0 10*3/uL (ref 0.0–0.1)
Basophils Relative: 0 %
Eosinophils Absolute: 0.2 10*3/uL (ref 0.0–0.5)
Eosinophils Relative: 2 %
HCT: 43.3 % (ref 36.0–46.0)
Hemoglobin: 13.7 g/dL (ref 12.0–15.0)
Immature Granulocytes: 0 %
Lymphocytes Relative: 40 %
Lymphs Abs: 3.5 10*3/uL (ref 0.7–4.0)
MCH: 28.4 pg (ref 26.0–34.0)
MCHC: 31.6 g/dL (ref 30.0–36.0)
MCV: 89.8 fL (ref 80.0–100.0)
Monocytes Absolute: 0.5 10*3/uL (ref 0.1–1.0)
Monocytes Relative: 6 %
Neutro Abs: 4.7 10*3/uL (ref 1.7–7.7)
Neutrophils Relative %: 52 %
Platelets: 270 10*3/uL (ref 150–400)
RBC: 4.82 MIL/uL (ref 3.87–5.11)
RDW: 12.1 % (ref 11.5–15.5)
WBC: 8.9 10*3/uL (ref 4.0–10.5)
nRBC: 0 % (ref 0.0–0.2)

## 2020-09-15 LAB — RESP PANEL BY RT-PCR (FLU A&B, COVID) ARPGX2
Influenza A by PCR: NEGATIVE
Influenza B by PCR: NEGATIVE
SARS Coronavirus 2 by RT PCR: NEGATIVE

## 2020-09-15 LAB — I-STAT BETA HCG BLOOD, ED (MC, WL, AP ONLY): I-stat hCG, quantitative: <5 m[IU]/mL (ref <5)

## 2020-09-15 IMAGING — MR MR HEAD W/O CM
11 of 14 series · 39 of 48 positions shown · non-contrast
Comparison: None.

CLINICAL DATA: Papilledema

EXAM:
MRI HEAD WITHOUT CONTRAST
TECHNIQUE: Multiplanar, multiecho pulse sequences of the brain and surrounding
structures were obtained without intravenous contrast.

[Series 5: DWI · axial · 3.0mm · 1.36mm/px · z∈[+2,+140]mm · 6 of 96 slices shown (1 of 2)]
[im 1/96]
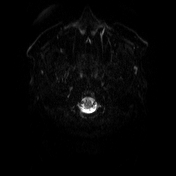
[im 20/96]
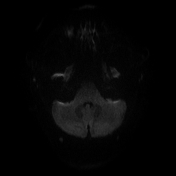
[im 39/96]
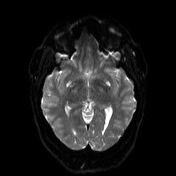
[im 58/96]
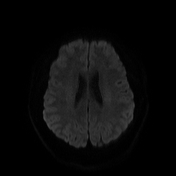
[im 77/96]
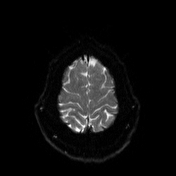
[im 96/96]
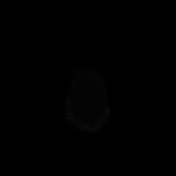

[Series 6: DWI · axial · 3.0mm · 1.36mm/px · z∈[+2,+140]mm · 4 of 48 slices shown (2 of 2)]
[im 1/48]
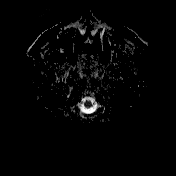
[im 16/48]
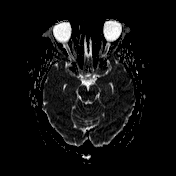
[im 32/48]
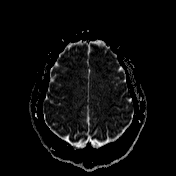
[im 48/48]
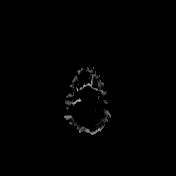

[Series 7: T1 · sagittal · 5.0mm · 0.75mm/px · 1 of 24 slices shown (1 of 2)]
[im 1/24]
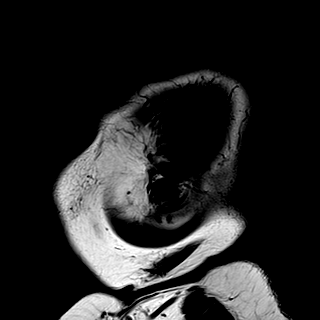

[Series 8: T2 · axial · 5.0mm · 0.62mm/px · 1 of 26 slices shown (1 of 2)]
[im 1/26]
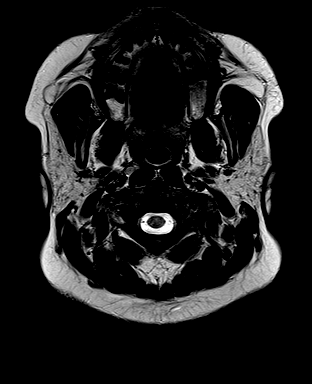

[Series 10: swi_images · axial · 3.0mm · 0.75mm/px · z∈[-34,+175]mm · 4 of 72 slices shown]
[im 1/72]
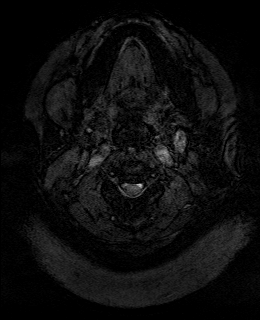
[im 24/72]
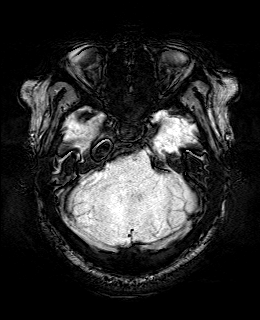
[im 48/72]
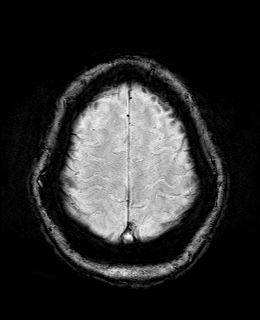
[im 72/72]
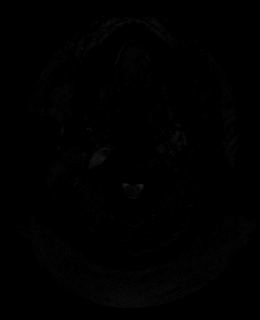

[Series 11: FLAIR · axial · 3.0mm · 0.75mm/px · z∈[-4,+146]mm · 3 of 52 slices shown]
[im 1/52]
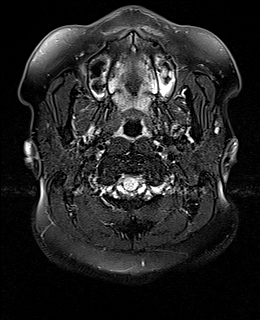
[im 26/52]
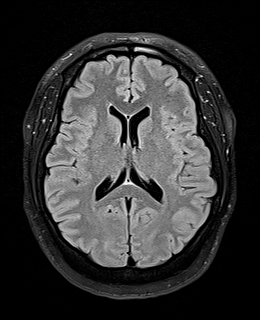
[im 52/52]
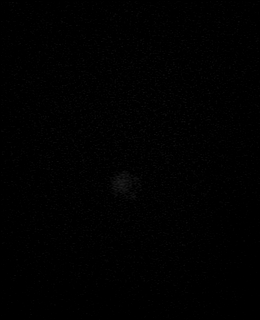

[Series 12: T1 · axial · 1.0mm · 0.94mm/px · z∈[-2,+138]mm · 8 of 144 slices shown (2 of 2)]
[im 1/144]
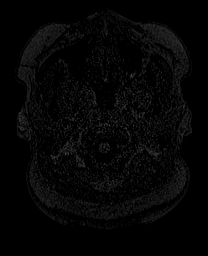
[im 21/144]
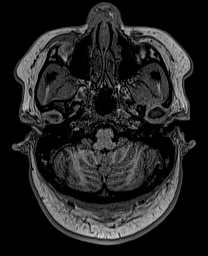
[im 41/144]
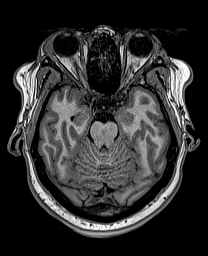
[im 62/144]
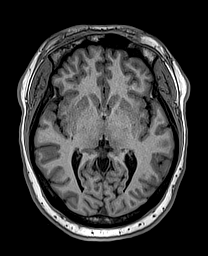
[im 82/144]
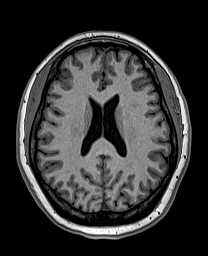
[im 103/144]
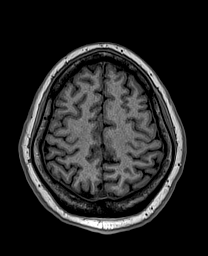
[im 123/144]
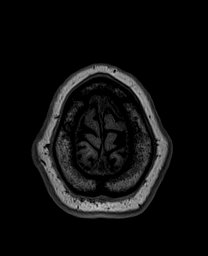
[im 144/144]
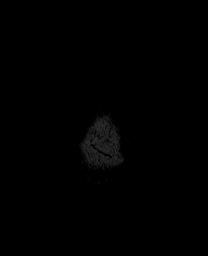

[Series 13: cor dwi_tracew · coronal · 5.0mm · 1.53mm/px · 3 of 54 slices shown]
[im 1/54]
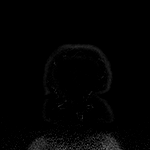
[im 27/54]
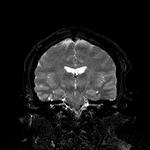
[im 54/54]
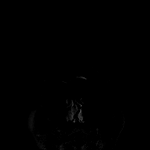

[Series 14: cor dwi_adc · coronal · 5.0mm · 1.53mm/px · 1 of 27 slices shown]
[im 1/27]
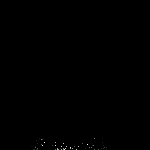

[Series 15: T2 · coronal · 5.0mm · 0.57mm/px · 2 of 32 slices shown (2 of 2)]
[im 1/32]
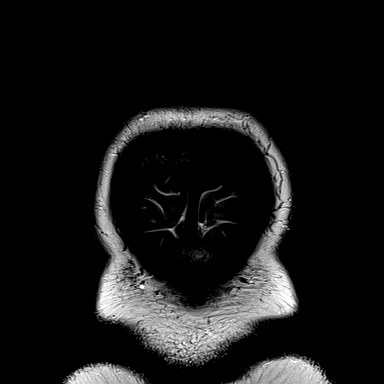
[im 32/32]
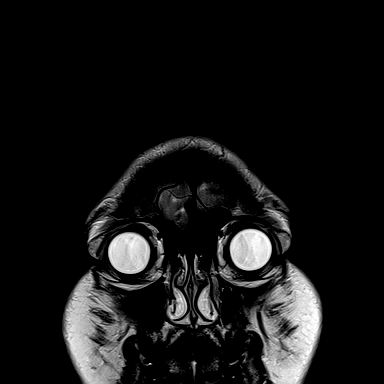

[Series 19: cor 2d · coronal · 2.5mm · 0.69mm/px · 6 of 110 slices shown]
[im 1/110]
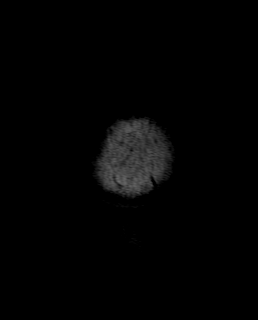
[im 22/110]
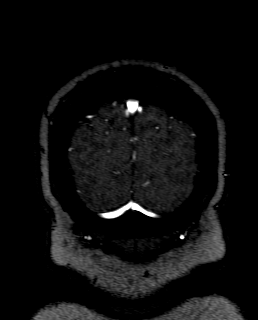
[im 44/110]
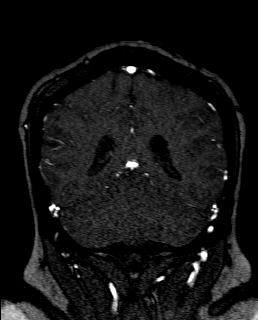
[im 66/110]
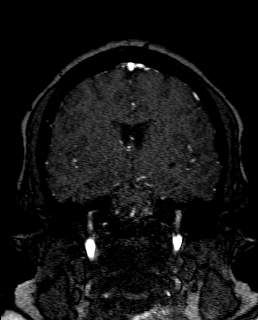
[im 88/110]
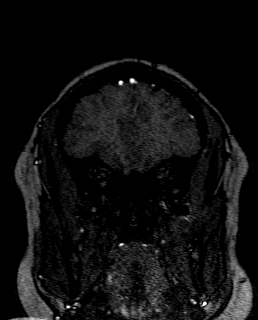
[im 110/110]
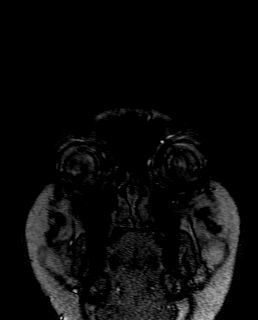

[39 of 48 positions shown; findings below may reference images not displayed]

FINDINGS: Brain: No acute infarct, mass effect or extra-axial collection. No
acute or chronic hemorrhage. Normal white matter signal, parenchymal
volume and CSF spaces. The midline structures are normal.

Vascular: Major flow voids are preserved.

Skull and upper cervical spine: Normal calvarium and skull base.
Visualized upper cervical spine and soft tissues are normal.

Sinuses/Orbits:No paranasal sinus fluid levels or advanced mucosal
thickening. No mastoid or middle ear effusion. Normal orbits.
IMPRESSION: Normal brain MRI.

## 2020-09-15 MED ORDER — LIDOCAINE HCL 2 % IJ SOLN
20.0000 mL | Freq: Once | INTRAMUSCULAR | Status: DC
  Filled 2020-09-15: qty 20

## 2020-09-15 MED ORDER — GADOBUTROL 1 MMOL/ML IV SOLN
10.0000 mL | Freq: Once | INTRAVENOUS | Status: AC | PRN
  Administered 2020-09-15: 10 mL via INTRAVENOUS

## 2020-09-15 NOTE — ED Notes (Signed)
Patient transported to MRI 

## 2020-09-15 NOTE — ED Provider Notes (Signed)
` [facility]-EMERGENCY DEPT Provider Note   CSN: [id number] Arrival date & time: 09/15/20  1624     History Chief Complaint  Patient presents with  . Blurred Vision    Crystal Melton is a 35 y.o. female.  HPI 35 year old female who presents to the ER from [facility] ophthalmology with concern for disc edema.  Patient reports that she got her grass clipping in her right eye several weeks ago.  She received a week of antibiotics, but continued to have some irritation and visual changes and went to the ophthalmologist.  She was seen by Dr. Randon Goldsmith, who sent her to the ER for MRI/MRV with concerns for pseudotumor cerebri.  She has continuous blurry vision in her right eye and the continuous headache that has been ongoing for the last several weeks.    History reviewed. No pertinent past medical history.  Patient Active Problem List   Diagnosis Date Noted  . Vision loss 09/15/2020    Past Surgical History:  Procedure Laterality Date  . CHOLECYSTECTOMY       OB History   No obstetric history on file.     Family History  Problem Relation Age of Onset  . Diabetes Mother   . Hypertension Mother   . Diabetes Father   . Hypertension Father   . COPD Father     Social History   Tobacco Use  . Smoking status: Never Smoker  . Smokeless tobacco: Never Used  Vaping Use  . Vaping Use: Never used  Substance Use Topics  . Alcohol use: Yes    Comment: occ  . Drug use: Never    Home Medications Prior to Admission medications   Medication Sig Start Date End Date Taking? Authorizing Provider  trimethoprim-polymyxin b (POLYTRIM) ophthalmic solution Place 1 drop into the right eye every 6 (six) hours.   Yes [provider]  ibuprofen (ADVIL) 200 MG tablet Take 200 mg by mouth every 6 (six) hours as needed for moderate pain.    [provider]    Allergies    Penicillins  Review of Systems   Review of Systems  Constitutional: Negative  for chills and fever.  HENT: Negative for ear pain and sore throat.   Eyes: Positive for visual disturbance. Negative for pain.  Respiratory: Negative for cough and shortness of breath.   Cardiovascular: Negative for chest pain and palpitations.  Gastrointestinal: Negative for abdominal pain and vomiting.  Genitourinary: Negative for dysuria and hematuria.  Musculoskeletal: Negative for arthralgias and back pain.  Skin: Negative for color change and rash.  Neurological: Positive for headaches. Negative for seizures and syncope.  All other systems reviewed and are negative.   Physical Exam Updated Vital Signs BP 138/72   Pulse 86   Temp 98.9 F (37.2 C) (Oral)   Resp 18   Ht 5\' 1"  (1.549 m)   Wt (!) 143.3 kg   SpO2 99%   BMI 59.71 kg/m   Physical Exam Vitals and nursing note reviewed.  Constitutional:      General: She is not in acute distress.    Appearance: She is well-developed.  HENT:     Head: Normocephalic and atraumatic.  Eyes:     Extraocular Movements: Extraocular movements intact.     Conjunctiva/sclera: Conjunctivae normal.     Pupils: Pupils are equal, round, and reactive to light.     Comments: Gross visual exam with decreased vision out of right eye   Cardiovascular:  Rate and Rhythm: Normal rate and regular rhythm.     Heart sounds: No murmur heard.   Pulmonary:     Effort: Pulmonary effort is normal. No respiratory distress.     Breath sounds: Normal breath sounds.  Abdominal:     Palpations: Abdomen is soft.     Tenderness: There is no abdominal tenderness. There is no right CVA tenderness or left CVA tenderness.  Musculoskeletal:        General: Normal range of motion.     Cervical back: Neck supple.  Skin:    General: Skin is warm and dry.  Neurological:     General: No focal deficit present.     Mental Status: She is alert and oriented to person, place, and time.  Psychiatric:        Mood and Affect: Mood normal.        Behavior:  Behavior normal.     ED Results / Procedures / Treatments   Labs (all labs ordered are listed, but only abnormal results are displayed) Labs Reviewed  RESP PANEL BY RT-PCR (FLU A&B, COVID) ARPGX2  CBC WITH DIFFERENTIAL/PLATELET  I-STAT CHEM 8, ED  I-STAT BETA HCG BLOOD, ED ([facility], [facility], [facility] ONLY)    EKG None  Radiology MR BRAIN WO CONTRAST  Result Date: 09/15/2020 CLINICAL DATA:  Papilledema EXAM: MRI HEAD WITHOUT CONTRAST TECHNIQUE: Multiplanar, multiecho pulse sequences of the brain and surrounding structures were obtained without intravenous contrast. COMPARISON:  None. FINDINGS: Brain: No acute infarct, mass effect or extra-axial collection. No acute or chronic hemorrhage. Normal white matter signal, parenchymal volume and CSF spaces. The midline structures are normal. Vascular: Major flow voids are preserved. Skull and upper cervical spine: Normal calvarium and skull base. Visualized upper cervical spine and soft tissues are normal. Sinuses/Orbits:No paranasal sinus fluid levels or advanced mucosal thickening. No mastoid or middle ear effusion. Normal orbits. IMPRESSION: Normal brain MRI. Electronically Signed   By: Deatra Robinson M.D.   On: 09/15/2020 22:02   MR MRV HEAD W WO CONTRAST  Result Date: 09/15/2020 CLINICAL DATA:  Right eye foreign body. Papilledema. Blurry vision. EXAM: MR VENOGRAM HEAD WITHOUT AND WITH CONTRAST TECHNIQUE: Angiographic images of the intracranial venous structures were acquired using MRV technique without and with intravenous contrast. CONTRAST:  10mL GADAVIST GADOBUTROL 1 MMOL/ML IV SOLN COMPARISON:  No pertinent prior exam. FINDINGS: Superior sagittal sinus: Normal. Straight sinus: Normal. Inferior sagittal sinus, vein of Galen and internal cerebral veins: Normal. Transverse sinuses: Normal. Sigmoid sinuses: Normal. Visualized jugular veins: Normal. IMPRESSION: Normal MRV of the brain. Electronically Signed   By: Deatra Robinson M.D.   On: 09/15/2020 21:59     Procedures .Lumbar Puncture  Date/Time: 09/15/2020 10:26 PM Performed by: Mare Ferrari, PA-C Authorized by: Mare Ferrari, PA-C   Consent:    Consent obtained:  Verbal   Consent given by:  Patient   Risks, benefits, and alternatives were discussed: yes     Risks discussed:  Bleeding, infection, headache, pain, repeat procedure and nerve damage   Alternatives discussed:  No treatment Universal protocol:    Relevant documents present and verified: yes     Test results available: yes     Patient identity confirmed:  Verbally with patient Pre-procedure details:    Procedure purpose:  Diagnostic Procedure details:    Lumbar space:  L3-L4 interspace   Patient position:  L lateral decubitus   Needle gauge:  20   Needle type:  Spinal needle - Quincke tip  Needle length (in):  3.5   Number of attempts:  1   Fluid appearance:  Bloody Post-procedure details:    Procedure completion:  Tolerated well, no immediate complications     Medications Ordered in ED Medications  lidocaine (XYLOCAINE) 2 % (with pres) injection 400 mg (has no administration in time range)  gadobutrol (GADAVIST) 1 MMOL/ML injection 10 mL (10 mLs Intravenous Contrast Given 09/15/20 2150)    ED Course  I have reviewed the triage vital signs and the nursing notes.  Pertinent labs & imaging results that were available during my care of the patient were reviewed by me and considered in my medical decision making (see chart for details).    MDM Rules/Calculators/A&P                          35 year old female presents to the ER with need for MRI/MRV with concern for pseudotumor cerebri, sent from ophthalmology.  On arrival, she is otherwise well-appearing.  Vitals overall reassuring. Noted decreased vision out of right eye grossly, pt reports blurry but not complete vision of the right eye.  Otherwise neuro exam reassuring.  Basic lab work overall reassuring.  No acute abnormalities.   MRI/MRV normal.  Attempted lumbar puncture with Dr. Silverio Lay, unfortunately we were not able to obtain a successful LP.   Dr. Silverio Lay, spoke with Dr. Iver Nestle with neurology, plan for admission for inpatient IR LP.  Spoke with Dr. Toniann Fail who will admit the patient for further evaluation and treatment  This was a shared visit with my supervising physician Dr. Silverio Lay who independently saw and evaluated the patient & provided guidance in evaluation/management/disposition ,in agreement with care  Final Clinical Impression(s) / ED Diagnoses Final diagnoses:  Blurred vision    Rx / DC Orders ED Discharge Orders    None       Leone Brand 09/15/20 2244    Charlynne Pander, MD 09/15/20 2326

## 2020-09-15 NOTE — ED Triage Notes (Signed)
Patient states she got a grass clipping in her right eye. Patient received a week of antibiotics, but continued to have irritation and went to an optometrist. Patient was told she had disc edema and was told she needed an MRI. Patient is currently having blurred vision in the right eye.

## 2020-09-16 ENCOUNTER — Observation Stay (HOSPITAL_COMMUNITY): Payer: 59

## 2020-09-16 ENCOUNTER — Encounter (HOSPITAL_COMMUNITY): Payer: Self-pay | Admitting: Internal Medicine

## 2020-09-16 DIAGNOSIS — H538 Other visual disturbances: Secondary | ICD-10-CM | POA: Diagnosis not present

## 2020-09-16 DIAGNOSIS — I10 Essential (primary) hypertension: Secondary | ICD-10-CM | POA: Diagnosis not present

## 2020-09-16 LAB — BASIC METABOLIC PANEL
Anion gap: 7 (ref 5–15)
BUN: 10 mg/dL (ref 6–20)
CO2: 22 mmol/L (ref 22–32)
Calcium: 8.7 mg/dL — ABNORMAL LOW (ref 8.9–10.3)
Chloride: 107 mmol/L (ref 98–111)
Creatinine, Ser: 0.76 mg/dL (ref 0.44–1.00)
GFR, Estimated: >60 mL/min (ref >60)
Glucose, Bld: 85 mg/dL (ref 70–99)
Potassium: 3.5 mmol/L (ref 3.5–5.1)
Sodium: 136 mmol/L (ref 135–145)

## 2020-09-16 LAB — CSF CELL COUNT WITH DIFFERENTIAL
Other Cells, CSF: UNDETERMINED
RBC Count, CSF: 350 /mm3 — ABNORMAL HIGH
Tube #: 4
WBC, CSF: 1 /mm3 (ref 0–5)

## 2020-09-16 LAB — CBC
HCT: 37.2 % (ref 36.0–46.0)
Hemoglobin: 11.9 g/dL — ABNORMAL LOW (ref 12.0–15.0)
MCH: 28.4 pg (ref 26.0–34.0)
MCHC: 32 g/dL (ref 30.0–36.0)
MCV: 88.8 fL (ref 80.0–100.0)
Platelets: 244 10*3/uL (ref 150–400)
RBC: 4.19 MIL/uL (ref 3.87–5.11)
RDW: 12.1 % (ref 11.5–15.5)
WBC: 8.5 10*3/uL (ref 4.0–10.5)
nRBC: 0 % (ref 0.0–0.2)

## 2020-09-16 LAB — PROTEIN, CSF: Total  Protein, CSF: 28 mg/dL (ref 15–45)

## 2020-09-16 LAB — HIV ANTIBODY (ROUTINE TESTING W REFLEX): HIV Screen 4th Generation wRfx: NONREACTIVE

## 2020-09-16 LAB — GLUCOSE, CSF: Glucose, CSF: 54 mg/dL (ref 40–70)

## 2020-09-16 LAB — TSH: TSH: 1.09 u[IU]/mL (ref 0.350–4.500)

## 2020-09-16 IMAGING — MR MR ORBITS WO/W CM
8 series · 48 of 48 positions shown · IV contrast (10 GADAVIST)
Comparison: Noncontrast brain MRI [DATE].  MRV [DATE].

EXAM:
MRI HEAD WITH CONTRAST AND MRI ORBITS WITHOUT AND WITH CONTRAST
TECHNIQUE: Multiplanar, multiecho pulse sequences of the brain and surrounding
structures were obtained with intravenous contrast. Multiplanar,
multiecho pulse sequences of the orbits and surrounding structures
were obtained including fat saturation techniques, before and after
intravenous contrast administration.

CONTRAST:  10mL GADAVIST GADOBUTROL 1 MMOL/ML IV SOLN

[Series 9: T1 · sagittal · 5.0mm · 0.75mm/px · 2 of 21 slices shown (1 of 3)]
[im 1/21]
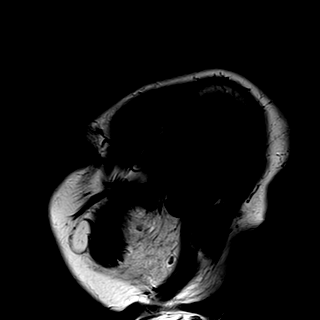
[im 21/21]
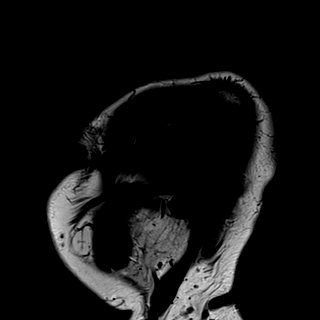

[Series 10: T2 fat-sat · axial · 3.0mm · 0.47mm/px · z∈[-26,+36]mm · 3 of 20 slices shown (1 of 2)]
[im 1/20]
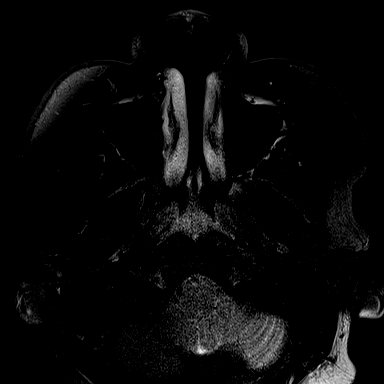
[im 10/20]
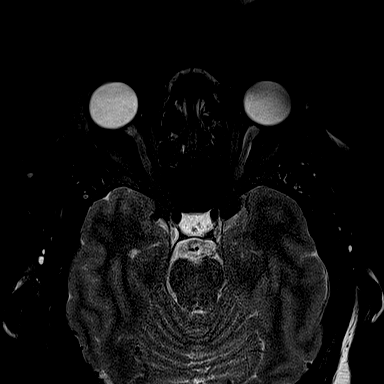
[im 20/20]
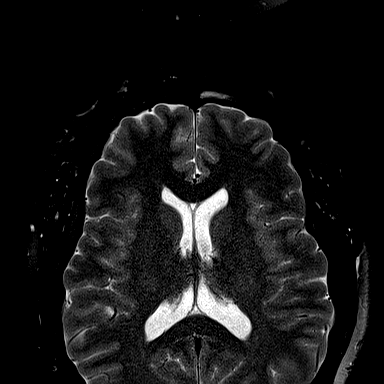

[Series 11: T1 · axial · 3.0mm · 0.56mm/px · z∈[-26,+36]mm · 3 of 20 slices shown (2 of 3)]
[im 1/20]
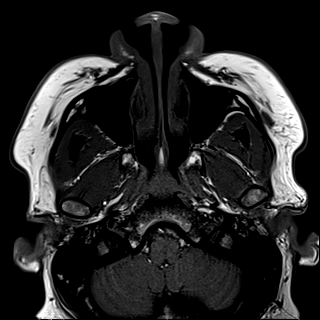
[im 10/20]
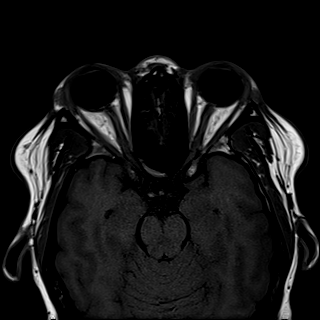
[im 20/20]
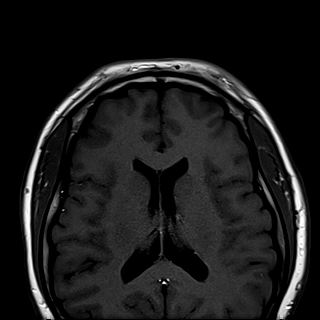

[Series 12: T1 · coronal · 3.0mm · 0.56mm/px · 5 of 32 slices shown (3 of 3)]
[im 1/32]
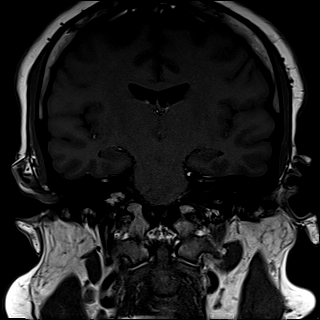
[im 8/32]
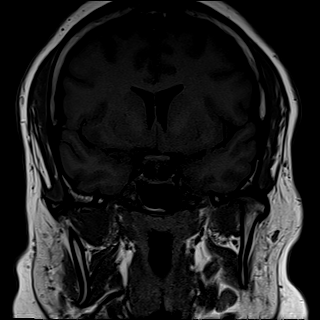
[im 16/32]
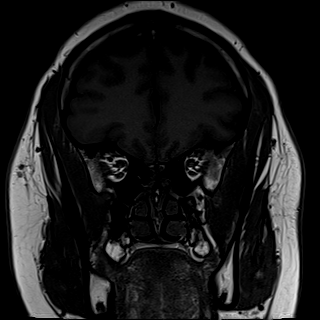
[im 24/32]
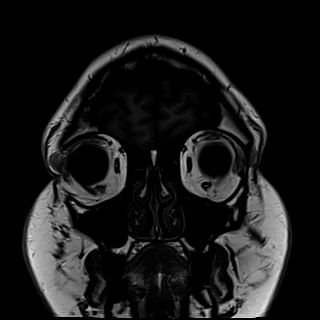
[im 32/32]
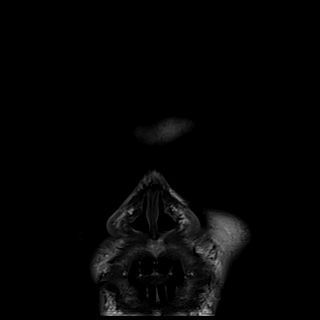

[Series 13: T2 fat-sat · coronal · 3.0mm · 0.47mm/px · 5 of 32 slices shown (2 of 2)]
[im 1/32]
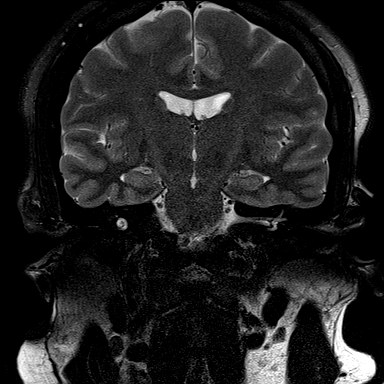
[im 8/32]
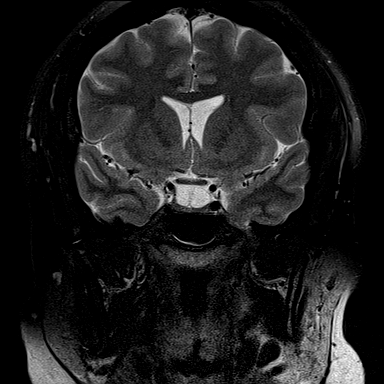
[im 16/32]
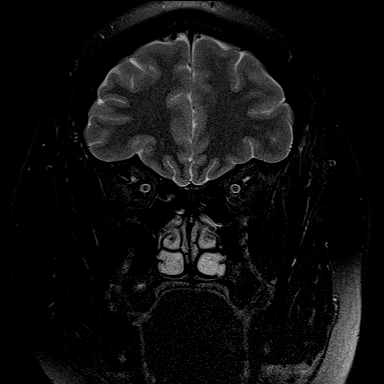
[im 24/32]
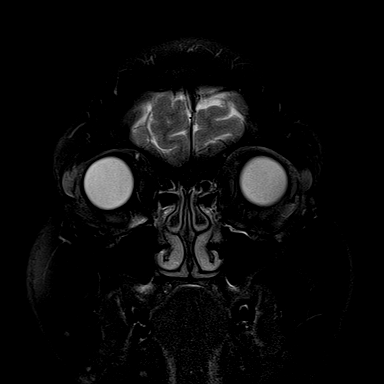
[im 32/32]
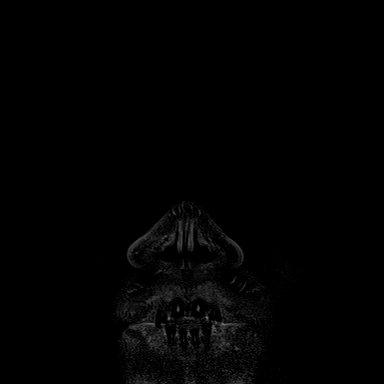

[Series 14: T1 fat-sat post-contrast · axial · 3.0mm · 0.56mm/px · z∈[-26,+36]mm · 3 of 20 slices shown (1 of 2)]
[im 1/20]
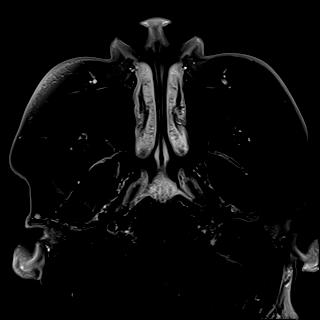
[im 10/20]
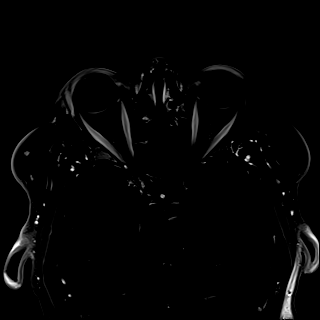
[im 20/20]
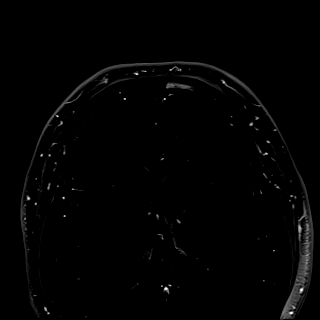

[Series 15: T1 fat-sat post-contrast · coronal · 3.0mm · 0.70mm/px · 5 of 32 slices shown (2 of 2)]
[im 1/32]
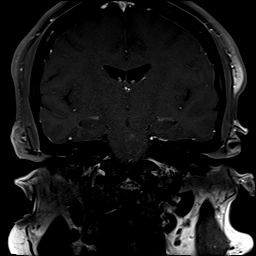
[im 8/32]
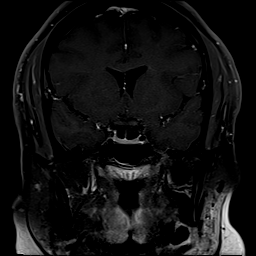
[im 16/32]
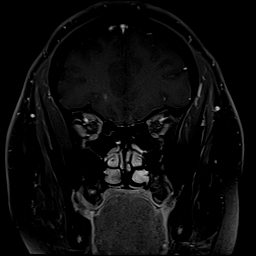
[im 24/32]
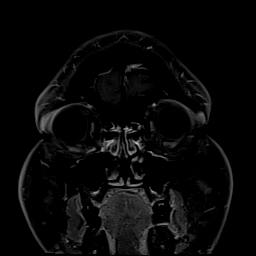
[im 32/32]
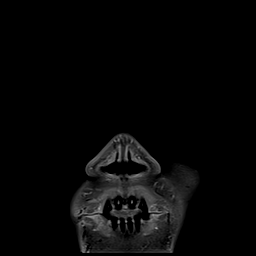

[Series 16: T1 post-contrast · axial · 1.0mm · 0.94mm/px · z∈[-37,+106]mm · 22 of 144 slices shown]
[im 1/144]
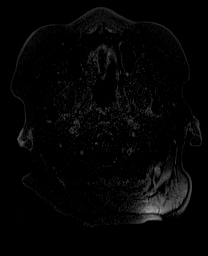
[im 7/144]
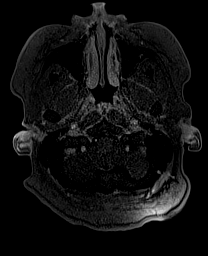
[im 14/144]
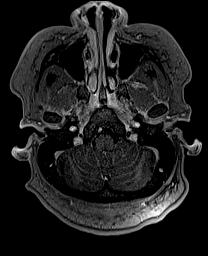
[im 21/144]
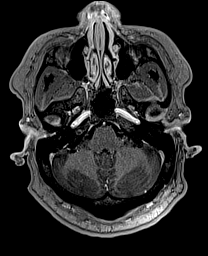
[im 28/144]
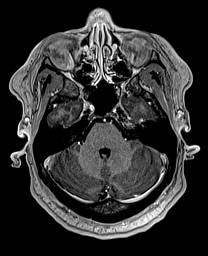
[im 35/144]
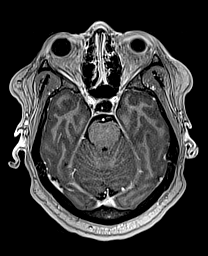
[im 41/144]
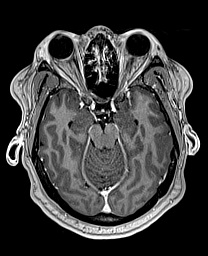
[im 48/144]
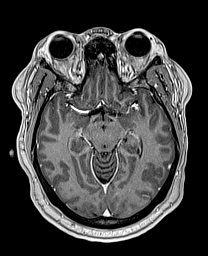
[im 55/144]
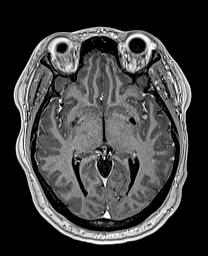
[im 62/144]
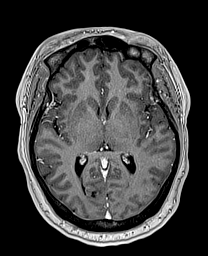
[im 69/144]
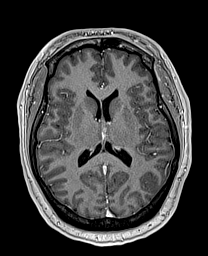
[im 75/144]
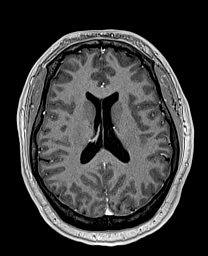
[im 82/144]
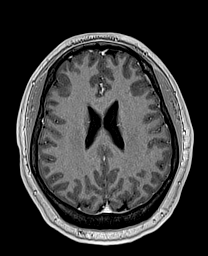
[im 89/144]
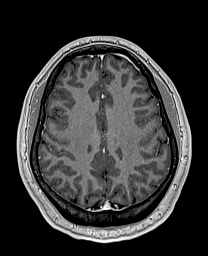
[im 96/144]
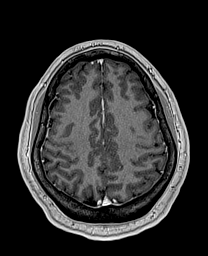
[im 103/144]
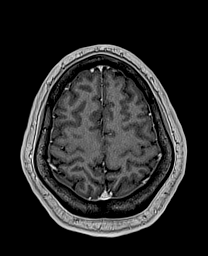
[im 109/144]
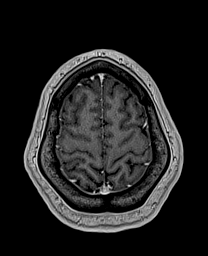
[im 116/144]
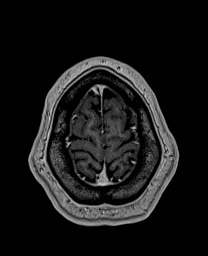
[im 123/144]
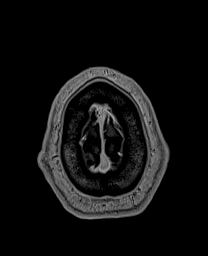
[im 130/144]
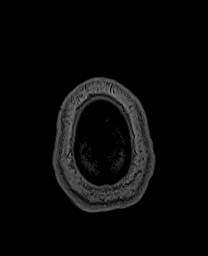
[im 137/144]
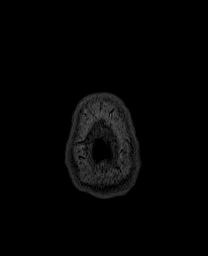
[im 144/144]
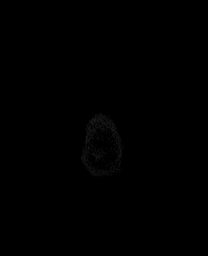

[48 of 48 positions shown; findings below may reference images not displayed]

FINDINGS: MRI HEAD FINDINGS

Brain:

A limited protocol contrast-enhanced brain MRI was performed as a
follow-up to the recent prior noncontrast brain MRI of [DATE].
Today's protocol consists of only a sagittal T1 weighted precontrast
sequence and an axial T1 weighted postcontrast sequence.

There is a 9 mm focus of enhancement within the anterior right
frontal lobe white matter, best appreciated on the T1 weighted
postcontrast imaging of the orbits (for instance as seen on series
15, image 17) (series 14, image 17). Mild T2/FLAIR hyperintense
signal and SWI signal loss was present at this site on yesterday's
brain MRI, and this is favored to reflect a developmental venous
anomaly. Additional subcentimeter focus of enhancement within the
paramedian left pons. SWI signal loss was also present at this site
on yesterday's brain MRI, and this is also favored to reflect a
vascular malformation such as a DVA or focus of capillary
telangiectasia.

Otherwise, no abnormal intracranial enhancement is identified.

Redemonstrated partially empty sella turcica.

Vascular: Expected enhancement within the dural venous sinuses and
proximal large arterial vessels.

Skull and upper cervical spine: No focal marrow lesion.

MRI ORBITS FINDINGS

Orbits: There is suggestion of subtle intraocular protrusion of the
right optic nerve head (series 10, image 11). Otherwise unremarkable
MRI appearance of the orbits. Specifically, the optic nerve sheath
complexes are symmetric and unremarkable without appreciable signal
abnormality or abnormal enhancement.

Visualized sinuses: Mild bilateral ethmoid sinus mucosal thickening.

Soft tissues: The visualized maxillofacial soft tissues are
unremarkable.
IMPRESSION: MRI brain:

1. 9 mm focus of enhancement within the anterior right frontal lobe
white matter, as described and likely reflecting a developmental
venous anomaly.
2. Additional subcentimeter focus of enhancement within the
paramedian left pons, also favored to reflect an incidental vascular
malformation such as a DVA or focus of capillary telangiectasia.
3. No other abnormal intracranial enhancement is identified.

MRI orbits:

1. Suggestion of subtle intraocular protrusion of the right optic
nerve head. A partially empty sella turcica is also noted. This
constellation of findings is suspicious for idiopathic intracranial
hypertension (pseudotumor cerebri). Correlate with findings on
pending lumbar puncture.
2. No MR evidence of optic neuritis.
3. Mild bilateral ethmoid sinus mucosal thickening.

## 2020-09-16 IMAGING — RF DG FLUORO GUIDE SPINAL/SI JT INJ*L*
1 series · 1 of 1 positions shown · non-contrast
Comparison: Brain MRI [DATE].

CLINICAL DATA: Rule out pseudotumor

EXAM:
DIAGNOSTIC LUMBAR PUNCTURE UNDER FLUOROSCOPIC GUIDANCE

[Series 1: cp_standard · 0.17mm/px · 1 of 1 slices shown]
[im 1/1]
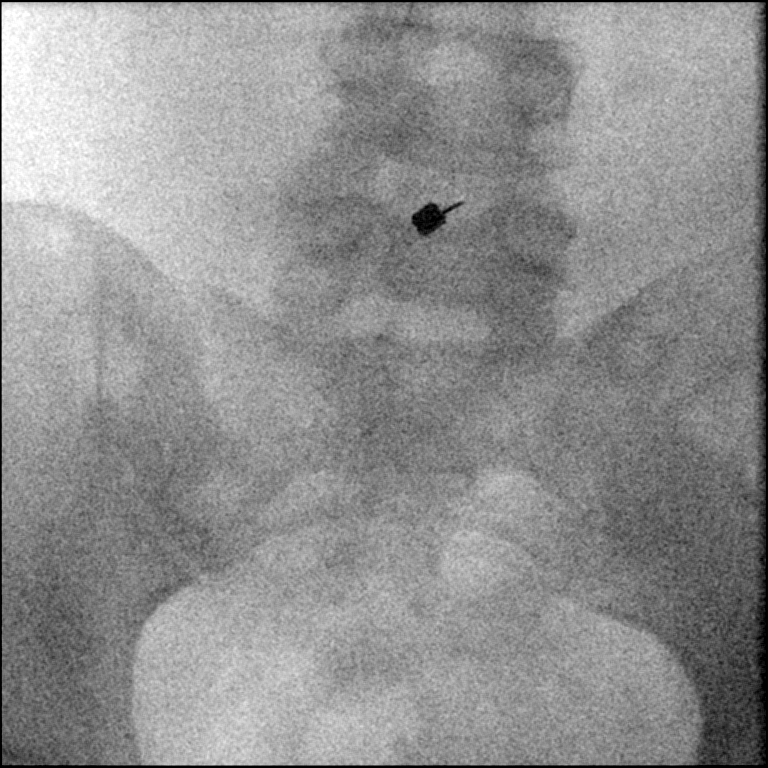

[1 of 1 positions shown; findings below may reference images not displayed]

FLUOROSCOPY TIME:  Fluoroscopy Time:  42 seconds

Radiation Exposure Index (if provided by the fluoroscopic device):
18.2 mGy

Number of Acquired Spot Images: 0

PROCEDURE:
Informed consent was obtained from the patient prior to the
procedure, including potential complications of headache, allergy,
and pain. With the patient prone, the lower back was prepped with
Betadine. 1% Lidocaine was used for local anesthesia. Lumbar
puncture was performed at the L4-5 level using a 20 gauge needle
with return of clear CSF with an opening pressure of 42.2 cm water.
Twenty-three ml of CSF were obtained for laboratory studies. The
patient tolerated the procedure well and there were no apparent
complications.
IMPRESSION: 1. Technically successful lumbar puncture with opening pressure of
42 cm of water.
2. 23 cc of clear CSF were removed. Following the procedure patient
reported significant improvement in symptoms.

## 2020-09-16 MED ORDER — GADOBUTROL 1 MMOL/ML IV SOLN
10.0000 mL | Freq: Once | INTRAVENOUS | Status: AC | PRN
  Administered 2020-09-16: 10 mL via INTRAVENOUS

## 2020-09-16 MED ORDER — ACETAMINOPHEN 325 MG PO TABS
650.0000 mg | ORAL_TABLET | Freq: Four times a day (QID) | ORAL | Status: DC | PRN
  Administered 2020-09-16: 650 mg via ORAL
  Filled 2020-09-16: qty 2

## 2020-09-16 MED ORDER — LIDOCAINE HCL (PF) 1 % IJ SOLN
INTRAMUSCULAR | Status: AC
  Filled 2020-09-16: qty 5

## 2020-09-16 NOTE — Consult Note (Signed)
Neurology Consultation Reason for Consult: Painless monocular vision loss Requesting Physician: Shawna Clamp  CC: Right eye blurred vision  History is obtained from:Patient and chart review   HPI: Crystal Melton is a 35 y.o. female with a past medical history significant for asymptomatic HSV infection, obesity (BMI 59.71), who presents with gradually progressive vision loss.  She reports that she was cutting grass on April 27 when she felt like she got a piece of glass in her eye.  She immediately washed it with a sterile eyewash that she had in her home first-aid kit, however the eye remained irritated.  About a week later she began to have blurry vision for which she saw urgent care and was prescribed trimethoprim polymyxin B ophthalmic solution for gross conjunctival erythema and concern for corneal irritation.  She was also recommended to use eyedrops for symptomatic relief and advised to follow-up with ophthalmology if symptoms persist or worsen.  Due to worsening vision she was evaluated by ophthalmology on 5/17, although notes are not available to me at this time for review.  She reports she was diagnosed with swelling in both of her eyes but "fluid" only in the right eye.  She reports that the vision is generally blurry in a patchy distribution and that with extended use of binocular vision (such as when watching TV) she does start to get a slight right headache up to 5 or 6/10 in intensity, which abates to 0/10 after she rests.  She additionally occasionally sees some white worms in the right eye on the right side of her vision.  She denies any pain with eye movements.  She reports she has started using a treadmill for the past 2 weeks and in that setting has lost 6 pounds.  Otherwise denies rashes, joint swelling, fevers, chills, sweats, other constitutional symptoms, any other focal neurological deficits now or in the past, any family history of autoimmune disease.  ROS: All other review  of systems was negative except as noted in the HPI.  History reviewed. No pertinent past medical history.  Other than detailed above    Family History  Problem Relation Age of Onset  . Diabetes Mother   . Hypertension Mother   . Diabetes Father   . Hypertension Father   . COPD Father   She specifically denies any family history of autoimmune disease including sarcoidosis and multiple sclerosis  Social History:  reports that she has never smoked. She has never used smokeless tobacco. She reports current alcohol use. She reports that she does not use drugs.  She reports 1 glass of wine a week socially, and occasional marijuana once every 1 to 2 months.  She is sexually active but uses protection.  She has not noticed any genital ulcers or vaginal discharge.  Exam: Current vital signs: BP 137/63   Pulse 62   Temp 98 F (36.7 C)   Resp 18   Ht 5' 1" (1.549 m)   Wt (!) 143.3 kg   SpO2 98%   BMI 59.71 kg/m  Vital signs in last 24 hours: Temp:  [98 F (36.7 C)-98.9 F (37.2 C)] 98 F (36.7 C) (05/18 0355) Pulse Rate:  [62-93] 62 (05/18 0355) Resp:  [16-18] 18 (05/18 0355) BP: (137-150)/(63-107) 137/63 (05/18 0355) SpO2:  [98 %-100 %] 98 % (05/18 0355) Weight:  [143.3 kg] 143.3 kg (05/17 1647)   Physical Exam  Constitutional: Appears well-developed and well-nourished.  Psych: Affect appropriate to situation, calm and cooperative Eyes: No scleral injection  HENT: No oropharyngeal obstruction.  MSK: no joint deformities.  Cardiovascular: Normal rate and regular rhythm.  Respiratory: Effort normal, non-labored breathing GI: Soft.  No distension. There is no tenderness.  Skin: Warm dry and intact visible skin  Neuro: Mental Status: Patient is awake, alert, oriented to person, place, month, year, and situation. Patient is able to give a clear and coherent history. No signs of aphasia or neglect Cranial Nerves: II: Visual Fields are full. Pupils are equal, round, and  reactive to light.  There is no afferent pupillary defect.  Unfortunately fundi challenging to visualize given brisk pupillary constriction with light.  Visual acuity is markedly reduced in the right eye compared to the left based on reading signage in her room.  Additionally she has reduced color saturation in the right eye III,IV, VI: EOMI without ptosis or diploplia.  V: Facial sensation is symmetric to temperature VII: Facial movement is symmetric.  VIII: hearing is intact to voice X: Uvula elevates symmetrically XI: Shoulder shrug is symmetric. XII: tongue is midline without atrophy or fasciculations.  Motor: Tone is normal. Bulk is normal. 5/5 strength was present in all four extremities except for mild bilateral hip flexion weakness 4+/5 Sensory: Sensation is symmetric to light touch and temperature in the arms and legs.  No length dependent vibratory loss Deep Tendon Reflexes: 3+ and symmetric in the biceps and patellae.  Plantars: Toes are mute bilaterally. Cerebellar: FNF and HKS are intact bilaterally Gait: Able to rise on heels and toes, casual gait is normal, able to tandem.    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  The history she gives is concerning for potential infectious process given the preceding trauma versus optic neuritis given her age and gender.  Work-up as below, neurology will continue to follow and  plan to next see patient on 5/19.  We will ophthalmology reported bilateral papilledema, given the fact that she denies any positional headache, diplopia, pulsatile tinnitus and does not have any visual symptoms in the left eye this would be a fairly atypical presentation for IIH.  Expedited inpatient work-up is necessary to prevent further vision loss and to determine the appropriate course of treatment to help aid recovery.  In particular an infectious process would be treated differently than an autoimmune process.  Recommendations: - MRI orbits w/ and w/o contrast (order present) - MRI brain w/ contrast (order present)  - Fluoro guided LP for opening pressure (please measure in lateral decubitus position), cell counts in tubes 1 and 4, protein, glucose, gram stain / culture, HSV 1/2 PCR, VZV CSF IgG, Oligoclonal bands, MOG (all CSF orders placed), - NMO serum antibodies (order placed)  - Obtain records from [facility] (order placed)  Lesleigh Noe MD-PhD [facility] [phone] Available 7 PM to 7 AM, outside of these hours please call Neurologist on call as listed on Amion.

## 2020-09-16 NOTE — Care Plan (Signed)
This 35 years old female with no significant past medical history presented in the ED with blurring of vision on the right side for last 3 weeks.  Patient had follow-up appointment with optometrist and was found to have papilledema and she was referred to the ED.  Patient denies any headache, nausea, vomiting, weakness.  There is no focal neurological deficit found on exam.  MRI brain and MRV were unremarkable.  Patient was seen by neurologist who recommended MRI orbits,  MRI brain with contrast and lumbar puncture to rule out infectious causes.  Patient was seen and examined.

## 2020-09-16 NOTE — H&P (Signed)
History and Physical    Crystal Melton KAJ:[id number] DOB: 01-May-1986 DOA: 09/15/2020  PCP: Pcp, No  Patient coming from: Home.  Chief Complaint: Right-sided blurry vision.  HPI: Crystal Melton is a 35 y.o. female with no significant past medical history has been experiencing right-sided blurred vision for the last 3 weeks since August 26, 2020.  Patient had follow-up with optometrist today and was found to have papilledema was referred to the ER.  Denies any headache nausea vomiting any weakness of the extremities.  Patient denies any fever chills.  ED Course: In the ER patient had MRI of the brain and MRV which were unremarkable.  On-call neurologist Dr. Iver Nestle was consulted and at this time neurologist recommended getting MRI brain with and without contrast and MRI orbits with and without contrast and also lumbar puncture.  Lumbar puncture was attempted by the ER patient was unsuccessful and fluoroscopic guided has been ordered.  COVID test negative.  Review of Systems: As per HPI, rest all negative.   History reviewed. No pertinent past medical history.  Past Surgical History:  Procedure Laterality Date  . CHOLECYSTECTOMY       reports that she has never smoked. She has never used smokeless tobacco. She reports current alcohol use. She reports that she does not use drugs.  Allergies  Allergen Reactions  . Penicillins Swelling    Family History  Problem Relation Age of Onset  . Diabetes Mother   . Hypertension Mother   . Diabetes Father   . Hypertension Father   . COPD Father     Prior to Admission medications   Medication Sig Start Date End Date Taking? Authorizing Provider  acidophilus (RISAQUAD) CAPS capsule Take 1 capsule by mouth daily.   Yes [provider]  cetirizine (ZYRTEC) 10 MG tablet Take 10 mg by mouth daily.   Yes [provider]  ibuprofen (ADVIL) 200 MG tablet Take 200 mg by mouth every 6 (six) hours as needed for moderate pain.    Yes [provider]  trimethoprim-polymyxin b (POLYTRIM) ophthalmic solution Place 1 drop into the right eye every 6 (six) hours.   Yes [provider]    Physical Exam: Constitutional: Moderately built and nourished. Vitals:   09/15/20 1647 09/15/20 1947 09/15/20 2245 09/15/20 2358  BP:  138/72 (!) 147/87 (!) 150/85  Pulse:  86 87 74  Resp:  18 18 18   Temp:    98.6 F (37 C)  TempSrc:      SpO2:  99% 100% 100%  Weight: (!) 143.3 kg     Height: 5\' 1"  (1.549 m)      Eyes: Anicteric no pallor.  Right eye blurred. ENMT: No mass felt.  No neck rigidity. Neck: No neck rigidity. Respiratory: No rhonchi or crepitations. Cardiovascular: S1-S2 heard. Abdomen: Soft nontender bowel sound present. Musculoskeletal: No edema. Skin: No rash. Neurologic: Alert awake oriented to time place and person.  Right  has blurred vision.  No neck rigidity.  No facial asymmetry.  Moves all extremities 5 x 5. Psychiatric: Appears normal.  Normal affect.   Labs on Admission: I have personally reviewed following labs and imaging studies  CBC: Recent Labs  Lab 09/15/20 1926 09/15/20 1953  WBC 8.9  --   NEUTROABS 4.7  --   HGB 13.7 15.0  HCT 43.3 44.0  MCV 89.8  --   PLT 270  --    Basic Metabolic Panel: Recent Labs  Lab 09/15/20 1953  NA 142  K 3.7  CL 105  GLUCOSE 77  BUN 9  CREATININE 0.80   GFR: Estimated Creatinine Clearance: 134.5 mL/min (by C-G formula based on SCr of 0.8 mg/dL). Liver Function Tests: No results for input(s): AST, ALT, ALKPHOS, BILITOT, PROT, ALBUMIN in the last 168 hours. No results for input(s): LIPASE, AMYLASE in the last 168 hours. No results for input(s): AMMONIA in the last 168 hours. Coagulation Profile: No results for input(s): INR, PROTIME in the last 168 hours. Cardiac Enzymes: No results for input(s): CKTOTAL, CKMB, CKMBINDEX, TROPONINI in the last 168 hours. BNP (last 3 results) No results for input(s): PROBNP in the last 8760  hours. HbA1C: No results for input(s): HGBA1C in the last 72 hours. CBG: No results for input(s): GLUCAP in the last 168 hours. Lipid Profile: No results for input(s): CHOL, HDL, LDLCALC, TRIG, CHOLHDL, LDLDIRECT in the last 72 hours. Thyroid Function Tests: No results for input(s): TSH, T4TOTAL, FREET4, T3FREE, THYROIDAB in the last 72 hours. Anemia Panel: No results for input(s): VITAMINB12, FOLATE, FERRITIN, TIBC, IRON, RETICCTPCT in the last 72 hours. Urine analysis: No results found for: COLORURINE, APPEARANCEUR, LABSPEC, PHURINE, GLUCOSEU, HGBUR, BILIRUBINUR, KETONESUR, PROTEINUR, UROBILINOGEN, NITRITE, LEUKOCYTESUR Sepsis Labs: @LABRCNTIP (procalcitonin:4,lacticidven:4) ) Recent Results (from the past 240 hour(s))  Resp Panel by RT-PCR (Flu A&B, Covid) Nasopharyngeal Swab     Status: None   Collection Time: 09/15/20 10:29 PM   Specimen: Nasopharyngeal Swab; Nasopharyngeal(NP) swabs in vial transport medium  Result Value Ref Range Status   SARS Coronavirus 2 by RT PCR NEGATIVE NEGATIVE Final    Comment: (NOTE) SARS-CoV-2 target nucleic acids are NOT DETECTED.  The SARS-CoV-2 RNA is generally detectable in upper respiratory specimens during the acute phase of infection. The lowest concentration of SARS-CoV-2 viral copies this assay can detect is 138 copies/mL. A negative result does not preclude SARS-Cov-2 infection and should not be used as the sole basis for treatment or other patient management decisions. A negative result may occur with  improper specimen collection/handling, submission of specimen other than nasopharyngeal swab, presence of viral mutation(s) within the areas targeted by this assay, and inadequate number of viral copies(<138 copies/mL). A negative result must be combined with clinical observations, patient history, and epidemiological information. The expected result is Negative.  Fact Sheet for Patients:   09/17/20  Fact Sheet for Healthcare Providers:  BloggerCourse.com  This test is no t yet approved or cleared by the [employer] FDA and  has been authorized for detection and/or diagnosis of SARS-CoV-2 by FDA under an Emergency Use Authorization (EUA). This EUA will remain  in effect (meaning this test can be used) for the duration of the COVID-19 declaration under Section 564(b)(1) of the Act, 21 U.S.C.section 360bbb-3(b)(1), unless the authorization is terminated  or revoked sooner.       Influenza A by PCR NEGATIVE NEGATIVE Final   Influenza B by PCR NEGATIVE NEGATIVE Final    Comment: (NOTE) The Xpert Xpress SARS-CoV-2/FLU/RSV plus assay is intended as an aid in the diagnosis of influenza from Nasopharyngeal swab specimens and should not be used as a sole basis for treatment. Nasal washings and aspirates are unacceptable for Xpert Xpress SARS-CoV-2/FLU/RSV testing.  Fact Sheet for Patients: [place]  Fact Sheet for Healthcare Providers: BloggerCourse.com  This test is not yet approved or cleared by the [employer] FDA and has been authorized for detection and/or diagnosis of SARS-CoV-2 by FDA under an Emergency Use Authorization (EUA). This EUA will remain in effect (meaning this test can be  used) for the duration of the COVID-19 declaration under Section 564(b)(1) of the Act, 21 U.S.C. section 360bbb-3(b)(1), unless the authorization is terminated or revoked.  Performed at [facility], [address] [place], [place] [address]      Radiological Exams on Admission: MR BRAIN WO CONTRAST  Result Date: 09/15/2020 CLINICAL DATA:  Papilledema EXAM: MRI HEAD WITHOUT CONTRAST TECHNIQUE: Multiplanar, multiecho pulse sequences of the brain and surrounding structures were obtained without intravenous contrast. COMPARISON:  None. FINDINGS:  Brain: No acute infarct, mass effect or extra-axial collection. No acute or chronic hemorrhage. Normal white matter signal, parenchymal volume and CSF spaces. The midline structures are normal. Vascular: Major flow voids are preserved. Skull and upper cervical spine: Normal calvarium and skull base. Visualized upper cervical spine and soft tissues are normal. Sinuses/Orbits:No paranasal sinus fluid levels or advanced mucosal thickening. No mastoid or middle ear effusion. Normal orbits. IMPRESSION: Normal brain MRI. Electronically Signed   By: Deatra Robinson M.D.   On: 09/15/2020 22:02   MR MRV HEAD W WO CONTRAST  Result Date: 09/15/2020 CLINICAL DATA:  Right eye foreign body. Papilledema. Blurry vision. EXAM: MR VENOGRAM HEAD WITHOUT AND WITH CONTRAST TECHNIQUE: Angiographic images of the intracranial venous structures were acquired using MRV technique without and with intravenous contrast. CONTRAST:  66mL GADAVIST GADOBUTROL 1 MMOL/ML IV SOLN COMPARISON:  No pertinent prior exam. FINDINGS: Superior sagittal sinus: Normal. Straight sinus: Normal. Inferior sagittal sinus, vein of Galen and internal cerebral veins: Normal. Transverse sinuses: Normal. Sigmoid sinuses: Normal. Visualized jugular veins: Normal. IMPRESSION: Normal MRV of the brain. Electronically Signed   By: Deatra Robinson M.D.   On: 09/15/2020 21:59     Assessment/Plan Principal Problem:   Vision loss Active Problems:   Blurred vision, right eye    1. Right eye blurred vision with ophthalmology visit showing papilledema.  Neurologist recommended MRI brain with and without contrast and MRI orbit with and without contrast.  Lumbar congenital been ordered.  We will await further recommendations from neurology.   DVT prophylaxis: SCDs for now in anticipation of lumbar puncture. Code Status: Full code. Family Communication: Discussed with patient. Disposition Plan: Home. Consults called: Neurology. Admission status:  Observation.   Eduard Clos MD [facility] Pager [phone].  If 7PM-7AM, please contact night-coverage [email or web address] Password [facility]  09/16/2020, 12:46 AM

## 2020-09-17 DIAGNOSIS — I10 Essential (primary) hypertension: Secondary | ICD-10-CM

## 2020-09-17 DIAGNOSIS — E669 Obesity, unspecified: Secondary | ICD-10-CM

## 2020-09-17 DIAGNOSIS — H547 Unspecified visual loss: Secondary | ICD-10-CM

## 2020-09-17 DIAGNOSIS — G932 Benign intracranial hypertension: Secondary | ICD-10-CM

## 2020-09-17 LAB — MISC LABCORP TEST (SEND OUT)
Labcorp test code: 505310
Labcorp test code: 999885

## 2020-09-17 LAB — RPR: RPR Ser Ql: NONREACTIVE

## 2020-09-17 MED ORDER — ACETAZOLAMIDE ER 500 MG PO CP12: 500.0000 mg | ORAL_CAPSULE | Freq: Two times a day (BID) | ORAL | Status: DC

## 2020-09-17 MED ORDER — ACETAMINOPHEN 325 MG PO TABS: 650.0000 mg | ORAL_TABLET | Freq: Four times a day (QID) | ORAL | Status: AC | PRN

## 2020-09-17 MED ORDER — ACETAZOLAMIDE 250 MG PO TABS
250.0000 mg | ORAL_TABLET | Freq: Two times a day (BID) | ORAL | Status: DC
  Administered 2020-09-17: 250 mg via ORAL
  Filled 2020-09-17: qty 1

## 2020-09-17 MED ORDER — ACETAZOLAMIDE ER 500 MG PO CP12: ORAL_CAPSULE | ORAL | 0 refills | Status: DC

## 2020-09-17 NOTE — Discharge Summary (Addendum)
Physician Discharge Summary  [name] UGQ:[id number] DOB: [birth year] DOA: 09/15/2020  PCP: Pcp, No  Admit date: 09/15/2020 Discharge date: 09/17/2020  Admitted From: Home  Disposition:   Home   Recommendations for Outpatient Follow-up and new medication changes:  1. Follow up with Primary care in 7 to 10 days.  2. Follow up with Neurology, Dr. Daisy Blossom at [facility]  3. Started on acetazolamide 250 mg daily for 7 days, then increase to 500 mg twice daily.  4. Aggressive weight reduction and life style modifications.  5. Follow up with Ophthalmology as outpatient.  6. Instructed to return to the ED in case of worsening headache or worsening vision changes, might need serial spinal taps.   Home Health: no   Equipment/Devices: no    Discharge Condition: stable  CODE STATUS: full  Diet recommendation: heart healthy and low salt.   Brief/Interim Summary: Crystal Melton was admitted to the hospital with a working diagnosis of idiopathic intracranial hypertension, pseudotumor cerebri.  35 year old female with past medical history for obesity class III who reported right-sided blurry vision for about 3 weeks.  She was seen by ophthalmology as an outpatient, finding papilledema, then she was referred to the hospital for further evaluation.  On her initial physical examination blood pressure 147/87, heart rate 87, respiratory rate 18, temperature 98.6, oxygen saturation 99%.  Her lungs were clear to auscultation bilaterally, heart S1-S2, present, rhythmic, soft abdomen, no lower extremity edema, neurologically patient had blurry vision on the right, no other focal abnormalities.  Sodium 136, potassium 3.5, chloride 107, bicarb 22, glucose 85, BUN 10, creatinine 0.76, white count 8.5, hemoglobin 11.9, hematocrit 37.2, platelets 244. SARS COVID-19 negative.  Brain MRI with no changes.  Patient underwent lumbar puncture, opening pressure 42 cm of water, 23 cc of CSF fluid was drained with  improvement of her symptoms. Glucose 54, RBC 350, white cell count 1, total protein 28.  She was diagnosed with pseudotumor cerebri, placed on diuretic therapy and recommended outpatient neurology follow-up.   1.  Idiopathic intracranial hypertension, pseudotumor cerebri.  Patient symptoms have improved after lumbar puncture and CSF drained.  CSF fluid so far negative for infection. Further work-up with contrast brain MRI showed no acute changes, morbid MRI, subtle intraocular protrusion of the right optic nerve head.  Partially empty sella turcica is noted.  No MRI evidence of optic neuritis.  Patient will continue therapy with acetazolamide. If worsening symptoms she may need to return to the ED for spinal cord drainage.  2.  Obesity class III.  Calculated BMI 59.7.  Patient will need aggressive lifestyle modifications along with low-salt diet.  3.  Hypertension.  Discharge blood pressure 132/82.  Patient has been placed on acetazolamide for intracranial hypertension.  Follow-up blood pressure as an outpatient.  Discharge Diagnoses:  Principal Problem:   Pseudotumor cerebri Active Problems:   Vision loss   Blurred vision, right eye   Class 3 obesity   Essential hypertension    Discharge Instructions   Allergies as of 09/17/2020      Reactions   Penicillins Swelling      Medication List    STOP taking these medications   ibuprofen 200 MG tablet Commonly known as: ADVIL     TAKE these medications   acetaminophen 325 MG tablet Commonly known as: TYLENOL Take 2 tablets (650 mg total) by mouth every 6 (six) hours as needed for moderate pain or headache.   acetaZOLAMIDE 500 MG capsule Commonly known as: DIAMOX Take half  tablet twice daly for 7 days, then continue taking one tablet twice daily.   acidophilus Caps capsule Take 1 capsule by mouth daily.   cetirizine 10 MG tablet Commonly known as: ZYRTEC Take 10 mg by mouth daily.   trimethoprim-polymyxin b  ophthalmic solution Commonly known as: POLYTRIM Place 1 drop into the right eye every 6 (six) hours.       Allergies  Allergen Reactions  . Penicillins Swelling    Consultations:  Neurology    Procedures/Studies: MR BRAIN WO CONTRAST  Result Date: 09/15/2020 CLINICAL DATA:  Papilledema EXAM: MRI HEAD WITHOUT CONTRAST TECHNIQUE: Multiplanar, multiecho pulse sequences of the brain and surrounding structures were obtained without intravenous contrast. COMPARISON:  None. FINDINGS: Brain: No acute infarct, mass effect or extra-axial collection. No acute or chronic hemorrhage. Normal white matter signal, parenchymal volume and CSF spaces. The midline structures are normal. Vascular: Major flow voids are preserved. Skull and upper cervical spine: Normal calvarium and skull base. Visualized upper cervical spine and soft tissues are normal. Sinuses/Orbits:No paranasal sinus fluid levels or advanced mucosal thickening. No mastoid or middle ear effusion. Normal orbits. IMPRESSION: Normal brain MRI. Electronically Signed   By: Deatra Robinson M.D.   On: 09/15/2020 22:02   MR BRAIN W CONTRAST  Result Date: 09/16/2020 EXAM: MRI HEAD WITH CONTRAST AND MRI ORBITS WITHOUT AND WITH CONTRAST TECHNIQUE: Multiplanar, multiecho pulse sequences of the brain and surrounding structures were obtained with intravenous contrast. Multiplanar, multiecho pulse sequences of the orbits and surrounding structures were obtained including fat saturation techniques, before and after intravenous contrast administration. CONTRAST:  79mL GADAVIST GADOBUTROL 1 MMOL/ML IV SOLN COMPARISON:  Noncontrast brain MRI 09/15/2020.  MRV 09/15/2020. FINDINGS: MRI HEAD FINDINGS Brain: A limited protocol contrast-enhanced brain MRI was performed as a follow-up to the recent prior noncontrast brain MRI of 09/15/2020. Today's protocol consists of only a sagittal T1 weighted precontrast sequence and an axial T1 weighted postcontrast sequence. There  is a 9 mm focus of enhancement within the anterior right frontal lobe white matter, best appreciated on the T1 weighted postcontrast imaging of the orbits (for instance as seen on series 15, image 17) (series 14, image 17). 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 Additional subcentimeter focus of enhancement within the paramedian left pons. SWI signal loss was also present at this site on yesterday's brain MRI, and this is also favored to reflect a vascular malformation such as a DVA or focus of capillary telangiectasia. Otherwise, no abnormal intracranial enhancement is identified. Redemonstrated partially empty sella turcica. Vascular: Expected enhancement within the dural venous sinuses and proximal large arterial vessels. Skull and upper cervical spine: No focal marrow lesion. MRI ORBITS FINDINGS Orbits: There is suggestion of subtle intraocular protrusion of the right optic nerve head (series 10, image 11). Otherwise unremarkable MRI appearance of the orbits. Specifically, the optic nerve sheath complexes are symmetric and unremarkable without appreciable signal abnormality or abnormal enhancement. Visualized sinuses: Mild bilateral ethmoid sinus mucosal thickening. Soft tissues: The visualized maxillofacial soft tissues are unremarkable. IMPRESSION: MRI brain: 1. 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 2. Additional subcentimeter focus of enhancement within the paramedian left pons, also favored to reflect an incidental vascular malformation such as a DVA or focus of capillary telangiectasia. 3. No other abnormal intracranial enhancement is identified.  MRI orbits: 1. Suggestion of subtle intraocular protrusion of the right optic nerve head. A partially empty sella turcica is also  noted. This constellation of findings is suspicious for idiopathic intracranial hypertension (pseudotumor cerebri). Correlate with findings on pending lumbar puncture. 2. No MR evidence of optic neuritis. 3. Mild bilateral ethmoid sinus mucosal thickening. Electronically Signed   By: Jackey Loge DO   On: 09/16/2020 15:59   MR MRV HEAD W WO CONTRAST  Result Date: 09/15/2020 CLINICAL DATA:  Right eye foreign body. Papilledema. Blurry vision. EXAM: MR VENOGRAM HEAD WITHOUT AND WITH CONTRAST TECHNIQUE: Angiographic images of the intracranial venous structures were acquired using MRV technique without and with intravenous contrast. CONTRAST:  10mL GADAVIST GADOBUTROL 1 MMOL/ML IV SOLN COMPARISON:  No pertinent prior exam. FINDINGS: Superior sagittal sinus: Normal. Straight sinus: Normal. Inferior sagittal sinus, vein of Galen and internal cerebral veins: Normal. Transverse sinuses: Normal. Sigmoid sinuses: Normal. Visualized jugular veins: Normal. IMPRESSION: Normal MRV of the brain. Electronically Signed   By: Deatra Robinson M.D.   On: 09/15/2020 21:59   MR ORBITS W WO CONTRAST  Result Date: 09/16/2020 EXAM: MRI HEAD WITH CONTRAST AND MRI ORBITS WITHOUT AND WITH CONTRAST TECHNIQUE: Multiplanar, multiecho pulse sequences of the brain and surrounding structures were obtained with intravenous contrast. Multiplanar, multiecho pulse sequences of the orbits and surrounding structures were obtained including fat saturation techniques, before and after intravenous contrast administration. CONTRAST:  10mL GADAVIST GADOBUTROL 1 MMOL/ML IV SOLN COMPARISON:  Noncontrast brain MRI 09/15/2020.  MRV 09/15/2020. FINDINGS: MRI HEAD FINDINGS Brain: A limited protocol contrast-enhanced brain MRI was performed as a follow-up to the recent prior noncontrast brain MRI of 09/15/2020. Today's protocol consists of only a sagittal T1 weighted precontrast sequence and an axial T1 weighted postcontrast sequence. There is a 9 mm focus of  enhancement within the anterior right frontal lobe white matter, best appreciated on the T1 weighted postcontrast imaging of the orbits (for instance as seen on series 15, image 17) (series 14, image 17). 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 Additional subcentimeter focus of enhancement within the paramedian left pons. SWI signal loss was also present at this site on yesterday's brain MRI, and this is also favored to reflect a vascular malformation such as a DVA or focus of capillary telangiectasia. Otherwise, no abnormal intracranial enhancement is identified. Redemonstrated partially empty sella turcica. Vascular: Expected enhancement within the dural venous sinuses and proximal large arterial vessels. Skull and upper cervical spine: No focal marrow lesion. MRI ORBITS FINDINGS Orbits: There is suggestion of subtle intraocular protrusion of the right optic nerve head (series 10, image 11). Otherwise unremarkable MRI appearance of the orbits. Specifically, the optic nerve sheath complexes are symmetric and unremarkable without appreciable signal abnormality or abnormal enhancement. Visualized sinuses: Mild bilateral ethmoid sinus mucosal thickening. Soft tissues: The visualized maxillofacial soft tissues are unremarkable. IMPRESSION: MRI brain: 1. 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 2. Additional subcentimeter focus of enhancement within the paramedian left pons, also favored to reflect an incidental vascular malformation such as a DVA or focus of capillary telangiectasia. 3. No other abnormal intracranial enhancement is identified. MRI orbits: 1. Suggestion of subtle intraocular protrusion of the right optic nerve head. A partially empty sella turcica is also noted. This  constellation of findings is  suspicious for idiopathic intracranial hypertension (pseudotumor cerebri). Correlate with findings on pending lumbar puncture. 2. No MR evidence of optic neuritis. 3. Mild bilateral ethmoid sinus mucosal thickening. Electronically Signed   By: Jackey Loge DO   On: 09/16/2020 15:59   DG Lumbar Puncture Fluoro Guide  Result Date: 09/16/2020 CLINICAL DATA:  Rule out pseudotumor EXAM: DIAGNOSTIC LUMBAR PUNCTURE UNDER FLUOROSCOPIC GUIDANCE COMPARISON:  Brain MRI 09/15/2020. FLUOROSCOPY TIME:  Fluoroscopy Time:  42 seconds Radiation Exposure Index (if provided by the fluoroscopic device): 18.2 mGy Number of Acquired Spot Images: 0 PROCEDURE: Informed consent was obtained from the patient prior to the procedure, including potential complications of headache, allergy, and pain. With the patient prone, the lower back was prepped with Betadine. 1% Lidocaine was used for local anesthesia. Lumbar puncture was performed at the L4-5 level using a 20 gauge needle with return of clear CSF with an opening pressure of 42.2 cm water. Twenty-three ml of CSF were obtained for laboratory studies. The patient tolerated the procedure well and there were no apparent complications. IMPRESSION: 1. Technically successful lumbar puncture with opening pressure of 42 cm of water. 2. 23 cc of clear CSF were removed. Following the procedure patient reported significant improvement in symptoms. Electronically Signed   By: Signa Kell M.D.   On: 09/16/2020 16:42      Procedures: lumbar puncture.   Subjective: Patient is feeling better, headache and visual changes are improving, no nausea or vomiting, no dyspnea or chest pain.   Discharge Exam: Vitals:   09/16/20 2105 09/17/20 0513  BP: (!) 154/91 132/82  Pulse: 79 67  Resp: 18 18  Temp: 98.4 F (36.9 C) 98.3 F (36.8 C)  SpO2: 99% 99%   Vitals:   09/16/20 1150 09/16/20 1617 09/16/20 2105 09/17/20 0513  BP: 137/87 113/75 (!) 154/91 132/82   Pulse: 76 80 79 67  Resp: 18  18 18   Temp: 98.2 F (36.8 C)  98.4 F (36.9 C) 98.3 F (36.8 C)  TempSrc: Oral     SpO2: 100% 100% 99% 99%  Weight:      Height:        General: Not in pain or dyspnea .  Neurology: Awake and alert, non focal  E ENT: no pallor, no icterus, oral mucosa moist Cardiovascular: No JVD. S1-S2 present, rhythmic, no gallops, rubs, or murmurs. No lower extremity edema. Pulmonary: postiive breath sounds bilaterally, adequate air movement, no wheezing, rhonchi or rales. Gastrointestinal. Abdomen soft and non tender Skin. No rashes Musculoskeletal: no joint deformities   The results of significant diagnostics from this hospitalization (including imaging, microbiology, ancillary and laboratory) are listed below for reference.     Microbiology: Recent Results (from the past 240 hour(s))  Resp Panel by RT-PCR (Flu A&B, Covid) Nasopharyngeal Swab     Status: None   Collection Time: 09/15/20 10:29 PM   Specimen: Nasopharyngeal Swab; Nasopharyngeal(NP) swabs in vial transport medium  Result Value Ref Range Status   SARS Coronavirus 2 by RT PCR NEGATIVE NEGATIVE Final    Comment: (NOTE) SARS-CoV-2 target nucleic acids are NOT DETECTED.  The SARS-CoV-2 RNA is generally detectable in upper respiratory specimens during the acute phase of infection. The lowest concentration of SARS-CoV-2 viral copies this assay can detect is 138 copies/mL. A negative result does not preclude SARS-Cov-2 infection and should not be used as the sole basis for treatment or other patient management decisions. A negative result may occur with  improper specimen collection/handling, submission of specimen other than  nasopharyngeal swab, presence of viral mutation(s) within the areas targeted by this assay, and inadequate number of viral copies(<138 copies/mL). A negative result must be combined with clinical observations, patient history, and epidemiological information. The expected  result is Negative.  Fact Sheet for Patients:  BloggerCourse.com  Fact Sheet for Healthcare Providers:  SeriousBroker.it  This test is no t yet approved or cleared by the [place] FDA and  has been authorized for detection and/or diagnosis of SARS-CoV-2 by FDA under an Emergency Use Authorization (EUA). This EUA will remain  in effect (meaning this test can be used) for the duration of the COVID-19 declaration under Section 564(b)(1) of the Act, 21 U.S.C.section 360bbb-3(b)(1), unless the authorization is terminated  or revoked sooner.       Influenza A by PCR NEGATIVE NEGATIVE Final   Influenza B by PCR NEGATIVE NEGATIVE Final    Comment: (NOTE) The Xpert Xpress SARS-CoV-2/FLU/RSV plus assay is intended as an aid in the diagnosis of influenza from Nasopharyngeal swab specimens and should not be used as a sole basis for treatment. Nasal washings and aspirates are unacceptable for Xpert Xpress SARS-CoV-2/FLU/RSV testing.  Fact Sheet for Patients: BloggerCourse.com  Fact Sheet for Healthcare Providers: SeriousBroker.it  This test is not yet approved or cleared by the [place] FDA and has been authorized for detection and/or diagnosis of SARS-CoV-2 by FDA under an Emergency Use Authorization (EUA). This EUA will remain in effect (meaning this test can be used) for the duration of the COVID-19 declaration under Section 564(b)(1) of the Act, 21 U.S.C. section 360bbb-3(b)(1), unless the authorization is terminated or revoked.  Performed at [facility], [address], [place], [place] [address]   CSF culture w Gram Stain     Status: None (Preliminary result)   Collection Time: 09/16/20  4:00 PM   Specimen: PATH Cytology CSF; Cerebrospinal Fluid  Result Value Ref Range Status   Specimen Description CSF  Final   Special Requests CSF  Final   Gram  Stain   Final    NO WBC SEEN NO ORGANISMS SEEN CYTOSPIN SMEAR Gram Stain Report Called to,Read Back By and Verified With: R.REID, RN AT 2008 ON 05.18.22 BY N.THOMPSON Performed at [facility], [address], [place], [place] [address]    Culture PENDING  Incomplete   Report Status PENDING  Incomplete     Labs: BNP (last 3 results) No results for input(s): BNP in the last 8760 hours. Basic Metabolic Panel: Recent Labs  Lab 09/15/20 1953 09/16/20 0501  NA 142 136  K 3.7 3.5  CL 105 107  CO2  --  22  GLUCOSE 77 85  BUN 9 10  CREATININE 0.80 0.76  CALCIUM  --  8.7*   Liver Function Tests: No results for input(s): AST, ALT, ALKPHOS, BILITOT, PROT, ALBUMIN in the last 168 hours. No results for input(s): LIPASE, AMYLASE in the last 168 hours. No results for input(s): AMMONIA in the last 168 hours. CBC: Recent Labs  Lab 09/15/20 1926 09/15/20 1953 09/16/20 0501  WBC 8.9  --  8.5  NEUTROABS 4.7  --   --   HGB 13.7 15.0 11.9*  HCT 43.3 44.0 37.2  MCV 89.8  --  88.8  PLT 270  --  244   Cardiac Enzymes: No results for input(s): CKTOTAL, CKMB, CKMBINDEX, TROPONINI in the last 168 hours. BNP: Invalid input(s): POCBNP CBG: No results for input(s): GLUCAP in the last 168 hours. D-Dimer No results for  input(s): DDIMER in the last 72 hours. Hgb A1c No results for input(s): HGBA1C in the last 72 hours. Lipid Profile No results for input(s): CHOL, HDL, LDLCALC, TRIG, CHOLHDL, LDLDIRECT in the last 72 hours. Thyroid function studies Recent Labs    09/16/20 0501  TSH 1.090   Anemia work up No results for input(s): VITAMINB12, FOLATE, FERRITIN, TIBC, IRON, RETICCTPCT in the last 72 hours. Urinalysis No results found for: COLORURINE, APPEARANCEUR, LABSPEC, PHURINE, GLUCOSEU, HGBUR, BILIRUBINUR, KETONESUR, PROTEINUR, UROBILINOGEN, NITRITE, LEUKOCYTESUR Sepsis Labs Invalid input(s): PROCALCITONIN,  WBC,  LACTICIDVEN Microbiology Recent Results (from  the past 240 hour(s))  Resp Panel by RT-PCR (Flu A&B, Covid) Nasopharyngeal Swab     Status: None   Collection Time: 09/15/20 10:29 PM   Specimen: Nasopharyngeal Swab; Nasopharyngeal(NP) swabs in vial transport medium  Result Value Ref Range Status   SARS Coronavirus 2 by RT PCR NEGATIVE NEGATIVE Final    Comment: (NOTE) SARS-CoV-2 target nucleic acids are NOT DETECTED.  The SARS-CoV-2 RNA is generally detectable in upper respiratory specimens during the acute phase of infection. The lowest concentration of SARS-CoV-2 viral copies this assay can detect is 138 copies/mL. A negative result does not preclude SARS-Cov-2 infection and should not be used as the sole basis for treatment or other patient management decisions. A negative result may occur with  improper specimen collection/handling, submission of specimen other than nasopharyngeal swab, presence of viral mutation(s) within the areas targeted by this assay, and inadequate number of viral copies(<138 copies/mL). A negative result must be combined with clinical observations, patient history, and epidemiological information. The expected result is Negative.  Fact Sheet for Patients:  BloggerCourse.com  Fact Sheet for Healthcare Providers:  SeriousBroker.it  This test is no t yet approved or cleared by the [place] FDA and  has been authorized for detection and/or diagnosis of SARS-CoV-2 by FDA under an Emergency Use Authorization (EUA). This EUA will remain  in effect (meaning this test can be used) for the duration of the COVID-19 declaration under Section 564(b)(1) of the Act, 21 U.S.C.section 360bbb-3(b)(1), unless the authorization is terminated  or revoked sooner.       Influenza A by PCR NEGATIVE NEGATIVE Final   Influenza B by PCR NEGATIVE NEGATIVE Final    Comment: (NOTE) The Xpert Xpress SARS-CoV-2/FLU/RSV plus assay is intended as an aid in the diagnosis of  influenza from Nasopharyngeal swab specimens and should not be used as a sole basis for treatment. Nasal washings and aspirates are unacceptable for Xpert Xpress SARS-CoV-2/FLU/RSV testing.  Fact Sheet for Patients: BloggerCourse.com  Fact Sheet for Healthcare Providers: SeriousBroker.it  This test is not yet approved or cleared by the [place] FDA and has been authorized for detection and/or diagnosis of SARS-CoV-2 by FDA under an Emergency Use Authorization (EUA). This EUA will remain in effect (meaning this test can be used) for the duration of the COVID-19 declaration under Section 564(b)(1) of the Act, 21 U.S.C. section 360bbb-3(b)(1), unless the authorization is terminated or revoked.  Performed at [facility], [address] [place], [place] [address]   CSF culture w Gram Stain     Status: None (Preliminary result)   Collection Time: 09/16/20  4:00 PM   Specimen: PATH Cytology CSF; Cerebrospinal Fluid  Result Value Ref Range Status   Specimen Description CSF  Final   Special Requests CSF  Final   Gram Stain   Final    NO WBC SEEN NO ORGANISMS SEEN CYTOSPIN SMEAR Gram Stain Report  Called to,Read Back By and Verified With: R.REID, RN AT 2008 ON 05.18.22 BY N.THOMPSON Performed at [facility], [address] [place], [place] [id number]    Culture PENDING  Incomplete   Report Status PENDING  Incomplete     Time coordinating discharge: 45 minutes  SIGNED:   Coralie KeensMauricio Daniel Idonna Heeren, MD  [facility] 09/17/2020, 10:05 AM

## 2020-09-17 NOTE — Progress Notes (Signed)
NEUROLOGY NOTE  Underwent LP under fluoroscopy guidance yesterday.  Appreciate radiology assistance.  Opening pressure on the spinal tap elevated at 42 cm of water. 23 cc of CSF drained. Preliminary CSF studies with increased RBCs but normal WBC count-likely bloody tap. Normal protein and glucose No signs of infection based on preliminary result  Spoke with the patient on the phone-I am physically at [facility] and the patient is at [facility]. She reports a very mild 1 out of 10 headache.  Also reports somewhat improvement in her vision. I explained the diagnosis, and treatment modalities.  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   Recs:  Start acetazolamide 250 mg twice daily for 1 week followed by increase to 500 mg twice daily.  Explained to her that the teratogenic effects of acetazolamide has not been studied in detail and she should be cautious if planning to get pregnant and speak with her doctors before she plans to get pregnant.  Low-salt diet  Aggressive weight loss-stressed importance of weight loss as being the cornerstone of her treatment.  Follow-up with outpatient neurology headache clinic-Dr. Daisy Blossom at [facility] neurology  Follow-up with ophthalmology  Instructed her to return to the emergency room should she have acute worsening of her vision or headache-might need serial spinal taps in that situation.  She verbalized understanding of the plan.  Relayed my plan via secure chat to the primary hospitalist Dr. Ella Jubilee.  Inpatient neurology will be available as needed.  Please call with questions.  -- Milon Dikes, MD Neurologist [facility] Pager: [phone]

## 2020-09-18 LAB — NEUROMYELITIS OPTICA AUTOAB, IGG: NMO-IgG: <1.5 U/mL (ref 0.0–3.0)

## 2020-09-18 LAB — OLIGOCLONAL BANDS, CSF + SERM

## 2020-09-19 LAB — HSV 1/2 PCR, CSF
HSV-1 DNA: NEGATIVE
HSV-2 DNA: NEGATIVE

## 2020-09-20 LAB — CSF CULTURE W GRAM STAIN
Culture: NO GROWTH
Gram Stain: NONE SEEN

## 2020-09-23 ENCOUNTER — Encounter: Payer: Self-pay | Admitting: Neurology

## 2020-09-23 ENCOUNTER — Ambulatory Visit: Payer: 59 | Admitting: Neurology

## 2020-09-23 VITALS — BP 116/84 | HR 78 | Ht 61.0 in | Wt 314.0 lb

## 2020-09-23 DIAGNOSIS — G932 Benign intracranial hypertension: Secondary | ICD-10-CM | POA: Diagnosis not present

## 2020-09-23 DIAGNOSIS — G4719 Other hypersomnia: Secondary | ICD-10-CM | POA: Diagnosis not present

## 2020-09-23 DIAGNOSIS — E669 Obesity, unspecified: Secondary | ICD-10-CM | POA: Diagnosis not present

## 2020-09-23 DIAGNOSIS — J322 Chronic ethmoidal sinusitis: Secondary | ICD-10-CM | POA: Diagnosis not present

## 2020-09-23 DIAGNOSIS — R0683 Snoring: Secondary | ICD-10-CM

## 2020-09-23 DIAGNOSIS — H579 Unspecified disorder of eye and adnexa: Secondary | ICD-10-CM

## 2020-09-23 MED ORDER — ACETAZOLAMIDE ER 500 MG PO CP12: 500.0000 mg | ORAL_CAPSULE | Freq: Two times a day (BID) | ORAL | 5 refills | Status: DC

## 2020-09-23 NOTE — Patient Instructions (Signed)
Ferri's Clinical Advisor 2018, 1st Edition (1st ed., pp. [phone].[phone].e6). [place], [place]: Elsevier."> Rosalie Gums and Winn Neurological Surgery (7th ed., pp. [phone]). [place], [place]: Elsevier, Inc."> https://www.ncbi.nlm.nih.gov/books/NBK507811/">  Idiopathic Intracranial Hypertension  Idiopathic intracranial hypertension (IIH) is a condition that increases pressure around the brain. The fluid that surrounds the brain and spinal cord (cerebrospinal fluid, or CSF) increases and causes the pressure. Idiopathic means that the cause of this condition is not known. IIH affects the brain and spinal cord (neurological disorder). If this condition is not treated, it can cause vision loss or blindness. What are the causes? The cause of this condition is not known. What increases the risk? The following factors may make you more likely to develop this condition:  Being very overweight (obese).  Being a female between the ages of 64 and 87 years old, who has not gone through menopause.  Taking certain medicines, such as birth control or steroids. What are the signs or symptoms? Symptoms of this condition include:  Headaches. This is the most common symptom.  Brief episodes of total blindness.  Double vision, blurred vision, or poor side (peripheral) vision.  Pain in the shoulders or neck.  Nausea and vomiting.  A sound like rushing water or a pulsing sound within the ears (pulsatile tinnitus), or ringing in the ears. How is this diagnosed? This condition may be diagnosed based on:  Your symptoms and medical history.  Imaging tests of the brain, such as: ? CT scan. ? MRI. ? Magnetic resonance venogram (MRV) to check the veins.  Diagnostic lumbar puncture. This is a procedure to remove and examine a sample of cerebrospinal fluid. This procedure can determine whether too much fluid may be causing IIH.  A thorough eye exam to check for swelling or nerve damage in the eyes. How is this  treated? Treatment for this condition depends on the symptoms. The goal of treatment is to decrease the pressure around your brain. Common treatments include:  Weight loss through healthy eating, salt restriction, and exercise, if you are overweight.  Medicines to decrease the production of spinal fluid and lower the pressure within your skull.  Medicines to prevent or treat headaches. Other treatments may include:  Surgery to place drains (shunts) in your brain for removing excess fluid.  Lumbar puncture to remove excess cerebrospinal fluid. Follow these instructions at home:  If you are overweight or obese, work with your health care provider to lose weight.  Take over-the-counter and prescription medicines only as told by your health care provider.  Ask your health care provider if the medicine prescribed to you requires you to avoid driving or using machinery.  Do not use any products that contain nicotine or tobacco, such as cigarettes, e-cigarettes, and chewing tobacco. If you need help quitting, ask your health care provider.  Keep all follow-up visits as told by your health care provider. This is important. Contact a health care provider if: You have changes in your vision, such as:  Double vision.  Blurred vision.  Poor peripheral vision. Get help right away if: You have any of the following symptoms and they get worse or do not get better:  Headaches.  Nausea.  Vomiting.  Sudden trouble seeing. Summary  Idiopathic intracranial hypertension (IIH) is a condition that increases pressure around the brain. The cause is not known (is idiopathic).  The most common symptom of IIH is headaches. Vision changes, pain in the shoulders or neck, nausea, and vomiting may also occur.  Treatment for this condition  depends on your symptoms. The goal of treatment is to decrease the pressure around your brain.  If you are overweight or obese, work with your health care  provider to lose weight.  Take over-the-counter and prescription medicines only as told by your health care provider. This information is not intended to replace advice given to you by your health care provider. Make sure you discuss any questions you have with your health care provider. Document Revised: 03/30/2019 Document Reviewed: 03/30/2019 Elsevier Patient Education  2021 Elsevier Inc.  Which medications are associated with idiopathic intracranial hypertension? Idiopathic intracranial hypertension is associated with a range of medicines, in particular vitamin A derivatives, contraceptives and tetracyclines. Idiopathic Intracranial Hypertension - Medsafe  medsafe.govt.nz/profs/PUArticles/[email or web address].

## 2020-09-23 NOTE — Progress Notes (Signed)
Provider:  Melvyn Novasarmen  Drayk Humbarger, MD  Primary Care Physician:    Dr. Randon [facility]mithLyles, [facility]   Referring Provider: Coralie KeensArrien, Mauricio Daniel, Md [address] [place] [id number]          Chief Complaint according to patient   Patient presents with:    . New Patient (Initial Visit)     Rm 10 with mom- Here to f/u on recent ED visit. Pt sts she went to the ED on 09/15/20 with burry vision and h/a. Pt was found to have a Cerebri Psedotumor was started on Diamox 500 mg. Pt sts she has been feeling better. Blurry vision and h/a are still present but not as severe. Reports the Diamox has cause tingling in the hands/feet.       HISTORY OF PRESENT ILLNESS:  Crystal Melton is a 35 y.o.  African American female patient and seen here upon an ED referral on 09/23/2020 from ED/ Hospitalist.  The Crystal Melton was diagnosed with pseudotumor cerebri or benign intracranial hypertension she had an MRI during her brief hospitalization also asked by Dr. Toniann FailKakrakandy.  She was admitted to 5 E. medical unit at the hospital her noncontrast MRI had been performed on 517, it was followed by this contrast study which showed a 9 mm focus of enhancement in the anterior frontal lobe white matter, probably an inborn venous abnormality there was also a small lesion in the paramedian left pons.  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  Suggestion of a septal intraocular protrusion of the right optic nerve head and a partially empty sella to 6 are very suspicious for idiopathic intracranial hypertension-pseudotumor cerebri and I spinal tap had followed.  She also had a little bit of ethmoidal sinusitis.  PROCEDURE: Informed consent was obtained from the patient prior to the procedure, including potential complications of headache, allergy, and pain. With the patient prone, the lower back was prepped with Betadine. 1% Lidocaine was used for local anesthesia.  Lumbar puncture was performed at the L4-5 level using a 20 gauge needle with return of clear CSF with an opening pressure of 42.2 cm water. Twenty-three ml of CSF were obtained for laboratory studies. The patient tolerated the procedure well and there were no apparent complications.  IMPRESSION: 1. Technically successful lumbar puncture with opening pressure of 42 cm of water. 2. 23 cc of clear CSF were removed. Following the procedure patient reported significant improvement in symptoms.   Electronically Signed   By: Signa Kellaylor  Melton M.D.     She has a  has no past medical history on file. Headaches randomly - now consistent. Right temple, eye and forehead, sinusitis suspected.  Grass clipping injury to eye 08-26-2020. Woken by headaches and waking with headaches.  Nausea - since gall bladder surgery/ no photophobia.  Lightheaded ness when bending.  Wakes up with a dry mouth.  Loud snoring- and apnea was witnessed. Averaging 5-6 hours of sleep, fragmented, Nocturia more than 3 times interruting sleep.      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   and lives in a household alone. Family status is single, 2 cats.  Tobacco use- quit cigarettes in 01-2020.   ETOH use; socialy,  Caffeine intake in form of Coffee( 1 cup a day). Regular exercise in form of treadmill.      Review of Systems: Out of a complete 14 system review, the patient complains  of only the following symptoms, and all other reviewed systems are negative.:   Woken by headaches and waking with headaches.  Nausea - since gall bladder surgery/ no photophobia. Diarrhea.  Lightheaded ness when bending.  Wakes up with a dry mouth.  Loud snoring- and apnea was witnessed. Averaging 5-6 hours of sleep, fragmented, Nocturia more than 3 times interrupting sleep.  naps frequently - 12.40- 14 .00 hours.    Headaches randomly - now consistent. Right temple, eye and forehead,  sinusitis suspected.  Grass clipping injury to eye 08-26-2020.   Peripheral vision changes. Eye doctor referred to ED - field test shows improvement.    Fatigue, sleepiness , snoring, fragmented sleep.   How likely are you to doze in the following situations: 0 = not likely, 1 = slight chance, 2 = moderate chance, 3 = high chance   Sitting and Reading? 3 Watching Television? 3 Sitting inactive in a public place (theater or meeting)? 1 As a passenger in a car for an hour without a break?2 Lying down in the afternoon when circumstances permit?3 Sitting and talking to someone? Sitting quietly after lunch without alcohol?naps frequently - 12.40- 14 .00 hours.  In a car, while stopped for a few minutes in traffic?   Total = 14/ 24 points   FSS endorsed at 46/ 63 points.   Social History   Socioeconomic History  . Marital status: Single    Spouse name: Not on file  . Number of children: Not on file  . Years of education: Not on file  . Highest education level: Not on file  Occupational History  . Not on file  Tobacco Use  . Smoking status: Never Smoker  . Smokeless tobacco: Never Used  Vaping Use  . Vaping Use: Never used  Substance and Sexual Activity  . Alcohol use: Yes    Comment: occ  . Drug use: Never  . Sexual activity: Not on file  Other Topics Concern  . Not on file  Social History Narrative  . Not on file   Social Determinants of Health   Financial Resource Strain: Not on file  Food Insecurity: Not on file  Transportation Needs: Not on file  Physical Activity: Not on file  Stress: Not on file  Social Connections: Not on file    Family History  Problem Relation Age of Onset  . Diabetes Mother   . Hypertension Mother   . Diabetes Father   . Hypertension Father   . COPD Father     History reviewed. No pertinent past medical history.  Past Surgical History:  Procedure Laterality Date  . CHOLECYSTECTOMY       Current Outpatient Medications on  File Prior to Visit  Medication Sig Dispense Refill  . acetaminophen (TYLENOL) 325 MG tablet Take 2 tablets (650 mg total) by mouth every 6 (six) hours as needed for moderate pain or headache.    Marland Kitchen acetaZOLAMIDE (DIAMOX) 500 MG capsule Take half tablet twice daly for 7 days, then continue taking one tablet twice daily. 90 capsule 0  . acidophilus (RISAQUAD) CAPS capsule Take 1 capsule by mouth daily.    . cetirizine (ZYRTEC) 10 MG tablet Take 10 mg by mouth daily.    Marland Kitchen trimethoprim-polymyxin b (POLYTRIM) ophthalmic solution Place 1 drop into the right eye every 6 (six) hours.     No current facility-administered medications on file prior to visit.    Allergies  Allergen Reactions  . Penicillins Swelling    Physical exam:  Today's Vitals   09/23/20 0933  BP: 116/84  Pulse: 78  SpO2: 96%  Weight: (!) 314 lb (142.4 kg)  Height: 5\' 1"  (1.549 m)   Body mass index is 59.33 kg/m.   Wt Readings from Last 3 Encounters:  09/23/20 (!) 314 lb (142.4 kg)  09/15/20 (!) 316 lb (143.3 kg)     Ht Readings from Last 3 Encounters:  09/23/20 5\' 1"  (1.549 m)  09/15/20 5\' 1"  (1.549 m)      General: The patient is awake, alert and appears not in acute distress. The patient is well groomed. Head: Normocephalic, atraumatic. Neck is supple.   Mallampati  3,  neck circumference: 17 inches . Nasal airflow patent.   Retrognathia is seen.  Dental status: biological  Cardiovascular:  Regular rate and cardiac rhythm by pulse,  without distended neck veins. Respiratory: Lungs are clear to auscultation.  Skin:  Without evidence of ankle edema, or rash. Trunk: The patient's posture is erect.   Neurologic exam : The patient is awake and alert, oriented to place and time.   Memory subjective described as intact.  Attention span & concentration ability appears normal.  Speech is fluent,  without  dysarthria, dysphonia or aphasia.  Mood and affect are appropriate.   Cranial nerves: no loss of  smell or taste reported  Pupils are equal and briskly reactive to light. Funduscopic exam deferred. Had just eye exam with ophthalmology. .  Extraocular movements in vertical and horizontal planes were intact and without nystagmus. No Diplopia. Visual fields by finger perimetry are intact. She has reportedly had restriction of the left peripheral field.  Hearing was intact to soft voice and tuning fork.     Facial sensation intact to fine touch.  Facial motor strength is symmetric and tongue and uvula move midline.  Neck ROM : rotation, tilt and flexion extension were normal for age and shoulder shrug was symmetrical.    Motor exam:  Symmetric bulk, tone and ROM.   Normal tone without cog-wheeling, symmetric grip strength .   Sensory:  Fine touch and vibration were normal.  Proprioception tested in the upper extremities was normal.   Coordination: Rapid alternating movements in the fingers/hands were of normal speed.  The Finger-to-nose maneuver was intact without evidence of ataxia, dysmetria or tremor.   Gait and station: Patient could rise unassisted from a seated position, walked without assistive device.  Stance is of normal width/ base and the patient turned with 3 steps.  Toe and heel walk were deferred.  Deep tendon reflexes: in the  upper and lower extremities are symmetric and intact. Brisker in upper extremities.  Babinski response was deferred.       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 Is also on a low sodium diet.  Increase electrolyte intake.   2) OSA high risk factors- pseudotumor, sleep  related headaches.  Weight loss and wellness referral, BMI is 60   3)   My Plan is to proceed with:  1) avoid tetracycline, retin-A and birth control pills.  2) Recovery of visual field with diamox. Refill.  3)  Weight loss and wellness  referral, BMI is 60  4) HST ordered.   I would like to thank Pcp, No and Arrien, York Ram, Md [address] [place],  [place] [address] for allowing me to meet with and to take care of this pleasant patient.   In short, Crystal Melton is presenting with super-obesity, EDS and headaches related to pseudotumor cerebri and sinusitis.  I plan to follow up either personally or through our NP within 3 month.   CC: I will share my notes with PCP.   Electronically signed by: Melvyn Novas, MD 09/23/2020 10:10 AM  [facility] Board certified by The [employer] of Sleep Medicine and Diplomate of the [employer] of Sleep Medicine. Board certified In Neurology through the ABPN, Fellow of the [employer] of Neurology. Medical Director of [facility].

## 2020-09-27 ENCOUNTER — Ambulatory Visit
Admission: RE | Admit: 2020-09-27 | Discharge: 2020-09-27 | Disposition: A | Discharge status: Home / Self Care | Payer: 59 | Source: Ambulatory Visit | Attending: Family Medicine | Admitting: Family Medicine

## 2020-09-27 ENCOUNTER — Other Ambulatory Visit: Payer: Self-pay

## 2020-09-27 VITALS — BP 125/85 | HR 90 | Temp 96.6°F | Resp 16

## 2020-09-27 DIAGNOSIS — B369 Superficial mycosis, unspecified: Secondary | ICD-10-CM | POA: Diagnosis not present

## 2020-09-27 HISTORY — DX: Benign intracranial hypertension: G93.2

## 2020-09-27 MED ORDER — CLOTRIMAZOLE-BETAMETHASONE 1-0.05 % EX CREA: TOPICAL_CREAM | CUTANEOUS | 0 refills | Status: DC

## 2020-09-27 NOTE — ED Provider Notes (Signed)
[facility]    CSN: [id number] Arrival date & time: 09/27/20  0998      History   Chief Complaint No chief complaint on file.   HPI Crystal Melton is a 35 y.o. female.   Reports that she has a patch of ringworm to her left buttock, right thigh as well as her chest.  States that they are itchy and flaky.  Has used clotrimazole over-the-counter with mild temporary relief.  States that they began showing up on 09/17/2020.  Denies previous symptoms.  Denies known contacts with similar symptoms.  Denies drainage from the area, erythema, chills, body aches, nausea, vomiting, diarrhea, other symptoms.  ROS per HPI  The history is provided by the patient.    Past Medical History:  Diagnosis Date  . Pseudotumor     Patient Active Problem List   Diagnosis Date Noted  . Class 3 obesity 09/17/2020  . Essential hypertension 09/17/2020  . Pseudotumor cerebri 09/17/2020  . Blurred vision, right eye 09/16/2020  . Vision loss 09/15/2020    Past Surgical History:  Procedure Laterality Date  . CHOLECYSTECTOMY      OB History   No obstetric history on file.      Home Medications    Prior to Admission medications   Medication Sig Start Date End Date Taking? Authorizing Provider  clotrimazole-betamethasone (LOTRISONE) cream Apply to affected area 2 times daily prn 09/27/20  Yes Moshe Cipro, NP  acetaminophen (TYLENOL) 325 MG tablet Take 2 tablets (650 mg total) by mouth every 6 (six) hours as needed for moderate pain or headache. 09/17/20   Arrien, York Ram, MD  acetaZOLAMIDE (DIAMOX) 500 MG capsule Take half tablet twice daly for 7 days, then continue taking one tablet twice daily. 09/17/20   Arrien, York Ram, MD  acetaZOLAMIDE (DIAMOX) 500 MG capsule Take 1 capsule (500 mg total) by mouth 2 (two) times daily. 09/23/20   Dohmeier, Porfirio Mylar, MD  acidophilus (RISAQUAD) CAPS capsule Take 1 capsule by mouth daily.    [provider]  cetirizine  (ZYRTEC) 10 MG tablet Take 10 mg by mouth daily.    [provider]  trimethoprim-polymyxin b (POLYTRIM) ophthalmic solution Place 1 drop into the right eye every 6 (six) hours.    [provider]    Family History Family History  Problem Relation Age of Onset  . Diabetes Mother   . Hypertension Mother   . Diabetes Father   . Hypertension Father   . COPD Father     Social History Social History   Tobacco Use  . Smoking status: Never Smoker  . Smokeless tobacco: Never Used  Vaping Use  . Vaping Use: Never used  Substance Use Topics  . Alcohol use: Yes    Comment: occ  . Drug use: Never     Allergies   Penicillins   Review of Systems Review of Systems   Physical Exam Triage Vital Signs ED Triage Vitals  Enc Vitals Group     BP 09/27/20 0956 125/85     Pulse Rate 09/27/20 0956 90     Resp 09/27/20 0956 16     Temp 09/27/20 0956 (!) 96.6 F (35.9 C)     Temp Source 09/27/20 0956 Temporal     SpO2 09/27/20 0956 100 %     Weight --      Height --      Head Circumference --      Peak Flow --      Pain  Score 09/27/20 0959 0     Pain Loc --      Pain Edu? --      Excl. in GC? --    No data found.  Updated Vital Signs BP 125/85 (BP Location: Right Arm)   Pulse 90   Temp (!) 96.6 F (35.9 C) (Temporal)   Resp 16   SpO2 100%   Visual Acuity Right Eye Distance:   Left Eye Distance:   Bilateral Distance:    Right Eye Near:   Left Eye Near:    Bilateral Near:     Physical Exam Vitals and nursing note reviewed.  Constitutional:      General: She is not in acute distress.    Appearance: She is well-developed. She is obese. She is not ill-appearing.  HENT:     Head: Normocephalic and atraumatic.     Nose: Nose normal.     Mouth/Throat:     Mouth: Mucous membranes are moist.     Pharynx: Oropharynx is clear.  Eyes:     Extraocular Movements: Extraocular movements intact.     Conjunctiva/sclera: Conjunctivae normal.     Pupils:  Pupils are equal, round, and reactive to light.  Cardiovascular:     Rate and Rhythm: Normal rate and regular rhythm.  Pulmonary:     Effort: Pulmonary effort is normal. No respiratory distress.  Musculoskeletal:        General: Normal range of motion.     Cervical back: Normal range of motion and neck supple.  Skin:    General: Skin is warm and dry.     Capillary Refill: Capillary refill takes less than 2 seconds.     Findings: Rash present.     Comments: Scaly patches of pruritic rash to left buttock, right thigh, to the chest.  All are about between 0.5 to 1 cm in diameter.  No drainage, erythema, bleeding noted from any of the areas.  Neurological:     General: No focal deficit present.     Mental Status: She is alert and oriented to person, place, and time.  Psychiatric:        Mood and Affect: Mood normal.        Behavior: Behavior normal.        Thought Content: Thought content normal.      UC Treatments / Results  Labs (all labs ordered are listed, but only abnormal results are displayed) Labs Reviewed - No data to display  EKG   Radiology No results found.  Procedures Procedures (including critical care time)  Medications Ordered in UC Medications - No data to display  Initial Impression / Assessment and Plan / UC Course  I have reviewed the triage vital signs and the nursing notes.  Pertinent labs & imaging results that were available during my care of the patient were reviewed by me and considered in my medical decision making (see chart for details).    Fungal skin infection  Prescribed clotrimazole with betamethasone Use twice a day to affected area Follow up with this office or with primary care if symptoms are persisting.  Follow up in the ER for high fever, trouble swallowing, trouble breathing, other concerning symptoms.   Final Clinical Impressions(s) / UC Diagnoses   Final diagnoses:  Fungal skin infection     Discharge Instructions      I have sent in Lotrisone cream for you to use twice a day as needed  Follow up with this office or with  primary care if symptoms are persisting.  Follow up in the ER for high fever, trouble swallowing, trouble breathing, other concerning symptoms.     ED Prescriptions    Medication Sig Dispense Auth. Provider   clotrimazole-betamethasone (LOTRISONE) cream Apply to affected area 2 times daily prn 15 g Moshe Cipro, NP     PDMP not reviewed this encounter.   Moshe Cipro, NP 09/27/20 1020

## 2020-09-27 NOTE — Discharge Instructions (Signed)
I have sent in Lotrisone cream for you to use twice a day as needed  Follow up with this office or with primary care if symptoms are persisting.  Follow up in the ER for high fever, trouble swallowing, trouble breathing, other concerning symptoms.

## 2020-09-27 NOTE — ED Triage Notes (Signed)
patient noticed a spot on her left bottom and then on right leg on May 19th.  Now the spots are spreading all over her body and are itchy

## 2020-10-07 ENCOUNTER — Other Ambulatory Visit: Payer: Self-pay | Admitting: Nurse Practitioner

## 2020-10-07 ENCOUNTER — Other Ambulatory Visit: Payer: Self-pay

## 2020-10-07 ENCOUNTER — Other Ambulatory Visit (HOSPITAL_COMMUNITY)
Admission: RE | Admit: 2020-10-07 | Discharge: 2020-10-07 | Disposition: A | Discharge status: Home / Self Care | Payer: 59 | Source: Ambulatory Visit | Attending: Nurse Practitioner | Admitting: Nurse Practitioner

## 2020-10-07 ENCOUNTER — Ambulatory Visit (INDEPENDENT_AMBULATORY_CARE_PROVIDER_SITE_OTHER): Payer: 59 | Admitting: Nurse Practitioner

## 2020-10-07 ENCOUNTER — Encounter: Payer: Self-pay | Admitting: Nurse Practitioner

## 2020-10-07 VITALS — BP 110/62 | HR 60 | Temp 98.1°F | Ht 61.0 in | Wt 310.8 lb

## 2020-10-07 DIAGNOSIS — Z23 Encounter for immunization: Secondary | ICD-10-CM

## 2020-10-07 DIAGNOSIS — G932 Benign intracranial hypertension: Secondary | ICD-10-CM

## 2020-10-07 DIAGNOSIS — Z7689 Persons encountering health services in other specified circumstances: Secondary | ICD-10-CM

## 2020-10-07 DIAGNOSIS — Z0001 Encounter for general adult medical examination with abnormal findings: Secondary | ICD-10-CM

## 2020-10-07 DIAGNOSIS — H6123 Impacted cerumen, bilateral: Secondary | ICD-10-CM

## 2020-10-07 DIAGNOSIS — Z202 Contact with and (suspected) exposure to infections with a predominantly sexual mode of transmission: Secondary | ICD-10-CM

## 2020-10-07 DIAGNOSIS — B369 Superficial mycosis, unspecified: Secondary | ICD-10-CM

## 2020-10-07 DIAGNOSIS — Z6841 Body Mass Index (BMI) 40.0 and over, adult: Secondary | ICD-10-CM

## 2020-10-07 DIAGNOSIS — E559 Vitamin D deficiency, unspecified: Secondary | ICD-10-CM | POA: Diagnosis not present

## 2020-10-07 DIAGNOSIS — Z0000 Encounter for general adult medical examination without abnormal findings: Secondary | ICD-10-CM

## 2020-10-07 MED ORDER — KETOCONAZOLE 2 % EX CREA: 1.0000 "application " | TOPICAL_CREAM | Freq: Two times a day (BID) | CUTANEOUS | 0 refills | Status: DC

## 2020-10-07 NOTE — Progress Notes (Signed)
I,Tianna Badgett,acting as a Education administrator for [employer], NP.,have documented all relevant documentation on the behalf of [employer], NP,as directed by  Bary Castilla, NP while in the presence of Bary Castilla, NP.  This visit occurred during the SARS-CoV-2 public health emergency.  Safety protocols were in place, including screening questions prior to the visit, additional usage of staff PPE, and extensive cleaning of exam room while observing appropriate contact time as indicated for disinfecting solutions.  Subjective:     Patient ID: Crystal Melton , female    DOB: 1985/12/01 , 35 y.o.   MRN: [id number]   Chief Complaint  Patient presents with  . Establish Care    HPI  Patient is here to establish care. She is also wanting to get a physical exam. She just relocated for [place]. Last time she saw doctor was in October. She is her due to choir. She thinks she has ringworm.  She is seeing a neurologist for her intracranial BP. And she is on medication for that. She also sees the Opthalmologic and goes back to them on 6/13. Paps smear in Nov. And has a IUD.  BP today was 110/62   Diet: she is eating salads, chicken. She is trying to eat healthy. Inreased her fish intake.  Exercise: she was walking on the treadmill.  Sexually active: yes  BC: yes  Smoke: no Drink: socially.      Past Medical History:  Diagnosis Date  . Pseudotumor      Family History  Problem Relation Age of Onset  . Diabetes Mother   . Hypertension Mother   . Diabetes Father   . Hypertension Father   . COPD Father   . Heart disease Brother   . Diabetes Maternal Grandmother   . Glaucoma Paternal Grandfather      Current Outpatient Medications:  .  acetaminophen (TYLENOL) 325 MG tablet, Take 2 tablets (650 mg total) by mouth every 6 (six) hours as needed for moderate pain or headache., Disp: , Rfl:  .  clotrimazole-betamethasone (LOTRISONE) cream, Apply to affected area 2 times daily prn,  Disp: 15 g, Rfl: 0 .  ketoconazole (NIZORAL) 2 % cream, Apply 1 application topically 2 (two) times daily., Disp: 30 g, Rfl: 0 .  acetaZOLAMIDE (DIAMOX) 500 MG capsule, Take 1 capsule (500 mg total) by mouth 2 (two) times daily., Disp: 60 capsule, Rfl: 5   Allergies  Allergen Reactions  . Penicillins Swelling      The patient states she uses IUD or birth control. Last LMP was Negative for: breast discharge, breast lump(s), breast pain and breast self exam. Associated symptoms include abnormal vaginal bleeding. Pertinent negatives include abnormal bleeding (hematology), anxiety, decreased libido, depression, difficulty falling sleep, dyspareunia, history of infertility, nocturia, sexual dysfunction, sleep disturbances, urinary incontinence, urinary urgency, vaginal discharge and vaginal itching. Diet regular.The patient states her exercise level is    . The patient's tobacco use is:  Social History   Tobacco Use  Smoking Status Never Smoker  Smokeless Tobacco Never Used  . She has been exposed to passive smoke. The patient's alcohol use is:  Social History   Substance and Sexual Activity  Alcohol Use Yes   Comment: occ  . Additional information: Last pap 03/2020, next one scheduled for 2024.    Review of Systems  Constitutional: Negative for chills and fever.  HENT: Negative for congestion, facial swelling, rhinorrhea, sinus pressure, sinus pain and trouble swallowing.   Eyes: Negative for itching and visual disturbance.  Respiratory: Negative for cough, shortness of breath and wheezing.   Cardiovascular: Negative for chest pain and palpitations.  Gastrointestinal: Negative for constipation and diarrhea.  Endocrine: Negative for polydipsia, polyphagia and polyuria.  Genitourinary: Negative for flank pain.  Musculoskeletal: Negative for arthralgias and myalgias.  Skin: Positive for color change and rash.       Rash on her buttocks.   Neurological: Negative for dizziness, weakness  and headaches.     Today's Vitals   10/07/20 1123  BP: 110/62  Pulse: 60  Temp: 98.1 F (36.7 C)  TempSrc: Oral  Weight: (!) 310 lb 12.8 oz (141 kg)  Height: 5' 1" (1.549 m)   Body mass index is 58.73 kg/m.  Wt Readings from Last 3 Encounters:  10/07/20 (!) 310 lb 12.8 oz (141 kg)  09/23/20 (!) 314 lb (142.4 kg)  09/15/20 (!) 316 lb (143.3 kg)    Objective:  Physical Exam Vitals and nursing note reviewed.  Constitutional:      Appearance: Normal appearance. She is obese.  HENT:     Head: Normocephalic and atraumatic.     Right Ear: Tympanic membrane, ear canal and external ear normal. There is impacted cerumen.     Left Ear: Tympanic membrane, ear canal and external ear normal. There is impacted cerumen.     Nose: Nose normal. No congestion or rhinorrhea.     Mouth/Throat:     Mouth: Mucous membranes are moist.     Pharynx: Oropharynx is clear.  Eyes:     Extraocular Movements: Extraocular movements intact.     Conjunctiva/sclera: Conjunctivae normal.     Pupils: Pupils are equal, round, and reactive to light.  Cardiovascular:     Rate and Rhythm: Normal rate and regular rhythm.     Pulses: Normal pulses.     Heart sounds: Normal heart sounds. No murmur heard.   Pulmonary:     Effort: Pulmonary effort is normal. No respiratory distress.     Breath sounds: Normal breath sounds. No wheezing.  Chest:  Breasts:     Tanner Score is 5.     Right: Normal.     Left: Normal.    Abdominal:     General: Abdomen is flat. Bowel sounds are normal.     Palpations: Abdomen is soft.  Genitourinary:    Comments: deferred Musculoskeletal:        General: Normal range of motion.     Cervical back: Normal range of motion and neck supple.  Skin:    General: Skin is warm and dry.     Capillary Refill: Capillary refill takes less than 2 seconds.     Findings: Rash present.     Comments: Scaly areas on her buttocks.   Neurological:     General: No focal deficit present.      Mental Status: She is alert and oriented to person, place, and time.  Psychiatric:        Mood and Affect: Mood normal.        Behavior: Behavior normal.         Assessment And Plan:     1. Establishing care with new doctor, encounter for --Patient is here to establish care. Martin Majestic over patient medical, family, social and surgical history. -Reviewed with patient their medications and any allergies  -Reviewed with patient their sexual orientation, drug/tobacco and alcohol use -Dicussed any new concerns with patient  -recommended patient comes in for a physical exam and complete blood work.  -Educated patient  about the importance of annual screenings and immunizations.  -Advised patient to eat a healthy diet along with exercise for atleast 30-45 min atleast 4-5 days of the week.   2. Encounter for annual physical exam --Patient is here for their annual physical exam and we discussed any changes to medication and medical history.  -Behavior modification was discussed as well as diet and exercise history  -Patient will continue to exercise regularly and modify their diet.  -Recommendation for yearly physical annuals, immunization and screenings including mammogram and colonoscopy were discussed with the patient.  -Recommended intake of multivitamin, vitamin D and calcium.  -Individualized advise was given to the patient pertaining to their own health history in regards to diet, exercise, medical condition and referrals.  - Hemoglobin A1c - CMP14+EGFR - Lipid panel - HIV antibody (with reflex)  3. Pseudotumor cerebri -She follows up with a neurologist  -Stable taking Diamox 500 mg BID   4. Vitamin D deficiency -Will check and supplement if needed. Advised patient to spend atleast 15 min. Daily in sunlight.  - Vitamin D (25 hydroxy)  5. Need for Tdap vaccination - Tdap vaccine greater than or equal to 7yo IM- today in office   6. STD exposure - NuSwab BV and Candida, NAA -  Urine cytology ancillary only  7. Bilateral impacted cerumen -Curette used in both ears to flush   8. Fungal skin infection - ketoconazole (NIZORAL) 2 % cream; Apply 1 application topically 2 (two) times daily.  Dispense: 30 g; Refill: 0  9. Class 3 severe obesity due to excess calories without serious comorbidity with body mass index (BMI) of 50.0 to 59.9 in adult [facility]) Advised patient on a healthy diet including avoiding fast food and red meats. Increase the intake of lean meats including grilled chicken and [place].  Drink a lot of water. Decrease intake of fatty foods. Exercise for 30-45 min. 4-5 a week to decrease the risk of cardiac event.   The patient was encouraged to call or send a message through [place] for any questions or concerns.   Side effects and appropriate use of all the medication(s) were discussed with the patient today. Patient advised to use the medication(s) as directed by their healthcare provider. The patient was encouraged to read, review, and understand all associated package inserts and contact our office with any questions or concerns. The patient accepts the risks of the treatment plan and had an opportunity to ask questions.   Follow up: if symptoms persist or do not get better.   .Patient was given opportunity to ask questions. Patient verbalized understanding of the plan and was able to repeat key elements of the plan. All questions were answered to their satisfaction.  Raman Ghumman, DNP   I, Raman Ghumman have reviewed all documentation for this visit. The documentation on 10/07/20 for the exam, diagnosis, procedures, and orders are all accurate and complete.    THE PATIENT IS ENCOURAGED TO PRACTICE SOCIAL DISTANCING DUE TO THE COVID-19 PANDEMIC.

## 2020-10-07 NOTE — Patient Instructions (Signed)
Rash, Adult  A rash is a change in the color of your skin. A rash can also change the way your skin feels. There are many different conditions and factors that can cause a rash. Follow these instructions at home: The goal of treatment is to stop the itching and keep the rash from spreading. Watch for any changes in your symptoms. Let your doctor know about them. Follow these instructions to help with your condition: Medicine Take or apply over-the-counter and prescription medicines only as told by your doctor. These may include medicines:  To treat red or swollen skin (corticosteroid creams).  To treat itching.  To treat an allergy (oral antihistamines).  To treat very bad symptoms (oral corticosteroids).   Skin care  Put cool cloths (compresses) on the affected areas.  Do not scratch or rub your skin.  Avoid covering the rash. Make sure that the rash is exposed to air as much as possible. Managing itching and discomfort  Avoid hot showers or baths. These can make itching worse. A cold shower may help.  Try taking a bath with: ? Epsom salts. You can get these at your local pharmacy or grocery store. Follow the instructions on the package. ? Baking soda. Pour a small amount into the bath as told by your doctor. ? Colloidal oatmeal. You can get this at your local pharmacy or grocery store. Follow the instructions on the package.  Try putting baking soda paste onto your skin. Stir water into baking soda until it gets like a paste.  Try putting on a lotion that relieves itchiness (calamine lotion).  Keep cool and out of the sun. Sweating and being hot can make itching worse. General instructions  Rest as needed.  Drink enough fluid to keep your pee (urine) pale yellow.  Wear loose-fitting clothing.  Avoid scented soaps, detergents, and perfumes. Use gentle soaps, detergents, perfumes, and other cosmetic products.  Avoid anything that causes your rash. Keep a journal to help  track what causes your rash. Write down: ? What you eat. ? What cosmetic products you use. ? What you drink. ? What you wear. This includes jewelry.  Keep all follow-up visits as told by your doctor. This is important.   Contact a doctor if:  You sweat at night.  You lose weight.  You pee (urinate) more than normal.  You pee less than normal, or you notice that your pee is a darker color than normal.  You feel weak.  You throw up (vomit).  Your skin or the whites of your eyes look yellow (jaundice).  Your skin: ? Tingles. ? Is numb.  Your rash: ? Does not go away after a few days. ? Gets worse.  You are: ? More thirsty than normal. ? More tired than normal.  You have: ? New symptoms. ? Pain in your belly (abdomen). ? A fever. ? Watery poop (diarrhea). Get help right away if:  You have a fever and your symptoms suddenly get worse.  You start to feel mixed up (confused).  You have a very bad headache or a stiff neck.  You have very bad joint pains or stiffness.  You have jerky movements that you cannot control (seizure).  Your rash covers all or most of your body. The rash may or may not be painful.  You have blisters that: ? Are on top of the rash. ? Grow larger. ? Grow together. ? Are painful. ? Are inside your nose or mouth.  You have   a rash that: ? Looks like purple pinprick-sized spots all over your body. ? Has a "bull's eye" or looks like a target. ? Is red and painful, causes your skin to peel, and is not from being in the sun too long. Summary  A rash is a change in the color of your skin. A rash can also change the way your skin feels.  The goal of treatment is to stop the itching and keep the rash from spreading.  Take or apply over-the-counter and prescription medicines only as told by your doctor.  Contact a doctor if you have new symptoms or symptoms that get worse.  Keep all follow-up visits as told by your doctor. This is  important. This information is not intended to replace advice given to you by your health care provider. Make sure you discuss any questions you have with your health care provider. Document Revised: 08/10/2018 Document Reviewed: 11/20/2017 Elsevier Patient Education  2021 Elsevier Inc.  

## 2020-10-08 LAB — URINE CYTOLOGY ANCILLARY ONLY
Bacterial Vaginitis-Urine: NEGATIVE
Candida Urine: NEGATIVE
Chlamydia: NEGATIVE
Comment: NEGATIVE
Comment: NEGATIVE
Comment: NORMAL
Neisseria Gonorrhea: NEGATIVE
Trichomonas: NEGATIVE

## 2020-10-08 LAB — LIPID PANEL
Chol/HDL Ratio: 4.3 ratio (ref 0.0–4.4)
Cholesterol, Total: 153 mg/dL (ref 100–199)
HDL: 36 mg/dL — ABNORMAL LOW (ref >39)
LDL Chol Calc (NIH): 94 mg/dL (ref 0–99)
Triglycerides: 125 mg/dL (ref 0–149)
VLDL Cholesterol Cal: 23 mg/dL (ref 5–40)

## 2020-10-08 LAB — CMP14+EGFR
ALT: 28 IU/L (ref 0–32)
AST: 14 IU/L (ref 0–40)
Albumin/Globulin Ratio: 1.5 (ref 1.2–2.2)
Albumin: 4.6 g/dL (ref 3.8–4.8)
Alkaline Phosphatase: 75 IU/L (ref 44–121)
BUN/Creatinine Ratio: 10 (ref 9–23)
BUN: 10 mg/dL (ref 6–20)
Bilirubin Total: 0.3 mg/dL (ref 0.0–1.2)
CO2: 17 mmol/L — ABNORMAL LOW (ref 20–29)
Calcium: 9.2 mg/dL (ref 8.7–10.2)
Chloride: 108 mmol/L — ABNORMAL HIGH (ref 96–106)
Creatinine, Ser: 0.96 mg/dL (ref 0.57–1.00)
Globulin, Total: 3 g/dL (ref 1.5–4.5)
Glucose: 85 mg/dL (ref 65–99)
Potassium: 4.2 mmol/L (ref 3.5–5.2)
Sodium: 141 mmol/L (ref 134–144)
Total Protein: 7.6 g/dL (ref 6.0–8.5)
eGFR: 80 mL/min/{1.73_m2} (ref >59)

## 2020-10-08 LAB — VITAMIN D 25 HYDROXY (VIT D DEFICIENCY, FRACTURES): Vit D, 25-Hydroxy: 11.5 ng/mL — ABNORMAL LOW (ref 30.0–100.0)

## 2020-10-08 LAB — HEMOGLOBIN A1C
Est. average glucose Bld gHb Est-mCnc: 103 mg/dL
Hgb A1c MFr Bld: 5.2 % (ref 4.8–5.6)

## 2020-10-08 LAB — HIV ANTIBODY (ROUTINE TESTING W REFLEX): HIV Screen 4th Generation wRfx: NONREACTIVE

## 2020-10-11 LAB — NUSWAB BV AND CANDIDA, NAA
BVAB 2: HIGH Score — AB
Candida albicans, NAA: NEGATIVE
Candida glabrata, NAA: NEGATIVE
Megasphaera 1: HIGH Score — AB

## 2020-10-12 ENCOUNTER — Other Ambulatory Visit: Payer: Self-pay | Admitting: Nurse Practitioner

## 2020-10-12 ENCOUNTER — Telehealth: Payer: Self-pay

## 2020-10-12 DIAGNOSIS — B9689 Other specified bacterial agents as the cause of diseases classified elsewhere: Secondary | ICD-10-CM

## 2020-10-12 DIAGNOSIS — E559 Vitamin D deficiency, unspecified: Secondary | ICD-10-CM

## 2020-10-12 MED ORDER — METRONIDAZOLE 500 MG PO TABS: 500.0000 mg | ORAL_TABLET | Freq: Two times a day (BID) | ORAL | 0 refills | Status: AC

## 2020-10-12 MED ORDER — VITAMIN D (ERGOCALCIFEROL) 1.25 MG (50000 UNIT) PO CAPS: ORAL_CAPSULE | ORAL | 0 refills | Status: DC

## 2020-10-12 NOTE — Telephone Encounter (Signed)
LVM for pt to call me back to schedule sleep study  

## 2020-10-12 NOTE — Telephone Encounter (Signed)
Returned pt's call. No answer, lvm for pt to call back to back to schedule sleep study

## 2020-10-21 ENCOUNTER — Ambulatory Visit (INDEPENDENT_AMBULATORY_CARE_PROVIDER_SITE_OTHER): Payer: 59 | Admitting: Neurology

## 2020-10-21 DIAGNOSIS — H579 Unspecified disorder of eye and adnexa: Secondary | ICD-10-CM

## 2020-10-21 DIAGNOSIS — G932 Benign intracranial hypertension: Secondary | ICD-10-CM

## 2020-10-21 DIAGNOSIS — E669 Obesity, unspecified: Secondary | ICD-10-CM

## 2020-10-21 DIAGNOSIS — G4733 Obstructive sleep apnea (adult) (pediatric): Secondary | ICD-10-CM

## 2020-10-21 DIAGNOSIS — R0683 Snoring: Secondary | ICD-10-CM

## 2020-10-21 DIAGNOSIS — G4719 Other hypersomnia: Secondary | ICD-10-CM

## 2020-10-21 DIAGNOSIS — J322 Chronic ethmoidal sinusitis: Secondary | ICD-10-CM

## 2020-10-22 NOTE — Progress Notes (Signed)
   [facility]  HOME SLEEP TEST (Watch PAT) REPORT  STUDY DATE: 10-21-20  DOB: 11/04/1985  MRN: [id number]  ORDERING CLINICIAN: Melvyn Novas, MD   REFERRING CLINICIAN: Arrien, York Ram, MD CLINICAL INFORMATION/HISTORY: Crystal Melton is here with HYPERSOMNIA and superobesity. She was diagnosed with pseudotumor cerebri or benign intracranial hypertension - a brain MRI was performed during her brief hospitalization also asked by Dr. Toniann Fail.  She was admitted and at the hospital ,a noncontrast MRI had been performed on 5/17, this was followed by a contrast study which showed a 9 mm focus of enhancement in the anterior frontal lobe white matter, probably a venous abnormality. There was also a small lesion in the paramedian left pons.  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  Suggestion of a septal intraocular protrusion of the right optic nerve head and a partially empty sella were very suspicious for idiopathic intracranial hypertension (=pseudotumor cerebri )and  spinal tap had followed.   She also had a mild ethmoidal sinusitis.  Epworth sleepiness score: 14/24. BMI: 59.5 kg/m Neck Circumference: 17"  Sleep Summary:   Total Recording Time (hours, min): 9 h 44 min Total Sleep Time (hours, min):  8 h 24 min  Percent REM (%):    23.06%   Respiratory Indices:   Calculated pAHI (per hour):  2.9/hour        REM pAHI:    4.1/hour      NREM pAHI: 2.7/hour  Oxygen Saturation Statistics:    Oxygen Saturation (%) Mean: 97%  Minimum oxygen saturation (%):     88%  O2 Saturation Range (%):88-100%   O2 Saturation (minutes) <=88%: 0.1 min  Pulse Rate Statistics:   Pulse Mean (bpm):   72/min   Pulse Range (51-109/min)   IMPRESSION: Remarkable is the absence of any clinically relevant degree of OSA (obstructive sleep apnea) or hypoxia , but severe primary snoring was captured , reflected in an RDI of 17.6/h.   RECOMMENDATION: Given  the low apnea degree, an intervention with CPAP is not needed. Treatment of snoring can be entertained, either by dental device or by ENT procedure.  The patient is to continue with medical treatment of elevated intracranial pressure.     INTERPRETING PHYSICIAN:  Melvyn Novas, MD    [facility] and [facility] Sleep Board certified by [employer] of Sleep Medicine and Diplomate of the [employer] of Sleep Medicine. Board certified In Neurology through the ABPN, Fellow of the [employer] of Neu[employer]. Medical Director of [employer].

## 2020-11-08 NOTE — Progress Notes (Signed)
IMPRESSION: Remarkable is the absence of any clinically relevant degree of OSA (obstructive sleep apnea) or hypoxia , but severe primary snoring was captured , reflected in an RDI of 17.6/h.   RECOMMENDATION: Given the low apnea degree, an intervention with CPAP is not needed. Treatment of snoring can be entertained, either by dental device or by ENT /sinusitis treatment for best nasal airflow.  The patient is to continue with medical treatment of elevated intracranial pressure.    INTERPRETING PHYSICIAN: Melvyn Novas, MD

## 2020-11-08 NOTE — Procedures (Signed)
[facility]  HOME SLEEP TEST (Watch PAT) REPORT  STUDY DATE: 10-21-20  DOB: [birth year]  MRN: [id number]  ORDERING CLINICIAN: Melvyn Novas, MD   REFERRING CLINICIAN: Arrien, York Ram, MD CLINICAL INFORMATION/HISTORY: Mrs. Crystal Melton is here with HYPERSOMNIA and superobesity. She was diagnosed with pseudotumor cerebri or benign intracranial hypertension - a brain MRI was performed during her brief hospitalization also asked by Dr. Toniann Fail.  She was admitted and at the hospital ,a noncontrast MRI had been performed on 5/17, this was followed by a contrast study which showed a 9 mm focus of enhancement in the anterior frontal lobe white matter, probably a venous abnormality. There was also a small lesion in the paramedian left pons.  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  Suggestion of a septal intraocular protrusion of the right optic nerve head and a partially empty sella were very suspicious for idiopathic intracranial hypertension (=pseudotumor cerebri )and  spinal tap had followed.   She also had a mild ethmoidal sinusitis.  Epworth sleepiness score: 14/24. BMI: 59.5 kg/m Neck Circumference: 17"  Sleep Summary:   Total Recording Time (hours, min): 9 h 44 min Total Sleep Time (hours, min):  8 h 24 min  Percent REM (%):    23.06%   Respiratory Indices:   Calculated pAHI (per hour):  2.9/hour        REM pAHI:    4.1/hour      NREM pAHI: 2.7/hour  Oxygen Saturation Statistics:    Oxygen Saturation (%) Mean: 97%  Minimum oxygen saturation (%):     88%  O2 Saturation Range (%):88-100%   O2 Saturation (minutes) <=88%: 0.1 min  Pulse Rate Statistics:   Pulse Mean (bpm):   72/min   Pulse Range (51-109/min)   IMPRESSION: Remarkable is the absence of any clinically relevant degree of OSA (obstructive sleep apnea) or hypoxia , but severe primary snoring was captured , reflected in an RDI of 17.6/h.   RECOMMENDATION: Given the low  apnea degree, an intervention with CPAP is not needed. Treatment of snoring can be entertained, either by dental device or by ENT /sinusitis treatment for best nasal airflow.  The patient is to continue with medical treatment of elevated intracranial pressure.     INTERPRETING PHYSICIAN:  Melvyn Novas, MD    [facility] and [facility] Sleep Board certified by [employer] of Sleep Medicine and Diplomate of the [employer] Sleep Medicine. Board certified In Neurology through the ABPN, Fellow of the [employer] of Neu[employer]. Medical Director of [employer].

## 2020-11-09 ENCOUNTER — Telehealth: Payer: Self-pay | Admitting: Neurology

## 2020-11-09 ENCOUNTER — Encounter: Payer: Self-pay | Admitting: Neurology

## 2020-11-09 NOTE — Telephone Encounter (Signed)
Called patient to discuss sleep study results. No answer at this time. LVM for the patient to call back.  Will also send a mychart message.  

## 2020-11-09 NOTE — Telephone Encounter (Signed)
-----   Message from Melvyn Novas, MD sent at 11/08/2020  5:17 PM EDT ----- IMPRESSION: Remarkable is the absence of any clinically relevant degree of OSA (obstructive sleep apnea) or hypoxia , but severe primary snoring was captured , reflected in an RDI of 17.6/h.   RECOMMENDATION: Given the low apnea degree, an intervention with CPAP is not needed. Treatment of snoring can be entertained, either by dental device or by ENT /sinusitis treatment for best nasal airflow.  The patient is to continue with medical treatment of elevated intracranial pressure.    INTERPRETING PHYSICIAN: Melvyn Novas, MD

## 2020-11-10 NOTE — Telephone Encounter (Signed)
Called the pt and was able to review the sleep study results with her in detail. Pt verbalized understanding. Pt had no questions at this time but was encouraged to call back if questions arise.

## 2020-12-27 ENCOUNTER — Other Ambulatory Visit: Payer: Self-pay | Admitting: Nurse Practitioner

## 2020-12-27 DIAGNOSIS — E559 Vitamin D deficiency, unspecified: Secondary | ICD-10-CM

## 2021-03-20 ENCOUNTER — Other Ambulatory Visit: Payer: Self-pay | Admitting: Neurology

## 2021-03-21 ENCOUNTER — Other Ambulatory Visit: Payer: Self-pay | Admitting: Nurse Practitioner

## 2021-03-21 DIAGNOSIS — E559 Vitamin D deficiency, unspecified: Secondary | ICD-10-CM

## 2021-03-22 MED ORDER — ACETAZOLAMIDE ER 500 MG PO CP12: 500.0000 mg | ORAL_CAPSULE | Freq: Two times a day (BID) | ORAL | 0 refills | Status: DC

## 2021-03-22 NOTE — Addendum Note (Signed)
Addended by: Melanee Spry on: 03/22/2021 11:02 AM   Modules accepted: Orders

## 2021-04-24 ENCOUNTER — Other Ambulatory Visit: Payer: Self-pay | Admitting: Neurology

## 2021-08-28 ENCOUNTER — Other Ambulatory Visit: Payer: Self-pay | Admitting: Neurology

## 2021-09-08 ENCOUNTER — Telehealth: Payer: Self-pay | Admitting: Neurology

## 2021-09-08 MED ORDER — ACETAZOLAMIDE ER 500 MG PO CP12: 500.0000 mg | ORAL_CAPSULE | Freq: Two times a day (BID) | ORAL | 0 refills | Status: DC

## 2021-09-08 NOTE — Telephone Encounter (Signed)
Pt is requesting a refill for acetaZOLAMIDE ER (DIAMOX) 500 MG capsule. ? ?Pharmacy: [facility] [phone]  ? ?

## 2021-09-08 NOTE — Telephone Encounter (Signed)
Refill has been sent and provided enough until up coming f/u appt with NP.  ?

## 2021-10-14 ENCOUNTER — Encounter: Payer: Self-pay | Admitting: Nurse Practitioner

## 2021-10-24 ENCOUNTER — Ambulatory Visit
Admission: RE | Admit: 2021-10-24 | Discharge: 2021-10-24 | Disposition: A | Discharge status: Home / Self Care | Payer: 59 | Source: Ambulatory Visit | Attending: Urgent Care | Admitting: Urgent Care

## 2021-10-24 VITALS — BP 132/88 | HR 89 | Temp 98.6°F | Resp 18

## 2021-10-24 DIAGNOSIS — R058 Other specified cough: Secondary | ICD-10-CM | POA: Diagnosis present

## 2021-10-24 DIAGNOSIS — J019 Acute sinusitis, unspecified: Secondary | ICD-10-CM | POA: Insufficient documentation

## 2021-10-24 DIAGNOSIS — Z113 Encounter for screening for infections with a predominantly sexual mode of transmission: Secondary | ICD-10-CM | POA: Diagnosis present

## 2021-10-24 DIAGNOSIS — R070 Pain in throat: Secondary | ICD-10-CM | POA: Insufficient documentation

## 2021-10-24 LAB — POCT RAPID STREP A (OFFICE): Rapid Strep A Screen: NEGATIVE

## 2021-10-24 MED ORDER — DOXYCYCLINE HYCLATE 100 MG PO CAPS: 100.0000 mg | ORAL_CAPSULE | Freq: Two times a day (BID) | ORAL | 0 refills | Status: DC

## 2021-10-24 MED ORDER — PSEUDOEPHEDRINE HCL 60 MG PO TABS: 60.0000 mg | ORAL_TABLET | Freq: Three times a day (TID) | ORAL | 0 refills | Status: DC | PRN

## 2021-10-24 MED ORDER — PROMETHAZINE-DM 6.25-15 MG/5ML PO SYRP: 5.0000 mL | ORAL_SOLUTION | Freq: Three times a day (TID) | ORAL | 0 refills | Status: DC | PRN

## 2021-10-24 MED ORDER — CETIRIZINE HCL 10 MG PO TABS: 10.0000 mg | ORAL_TABLET | Freq: Every day | ORAL | 0 refills | Status: DC

## 2021-10-25 LAB — CERVICOVAGINAL ANCILLARY ONLY
Chlamydia: NEGATIVE
Comment: NEGATIVE
Comment: NEGATIVE
Comment: NORMAL
Neisseria Gonorrhea: NEGATIVE
Trichomonas: POSITIVE — AB

## 2021-10-26 ENCOUNTER — Telehealth (HOSPITAL_COMMUNITY): Payer: Self-pay | Admitting: Emergency Medicine

## 2021-10-26 MED ORDER — METRONIDAZOLE 500 MG PO TABS: 500.0000 mg | ORAL_TABLET | Freq: Two times a day (BID) | ORAL | 0 refills | Status: DC

## 2021-10-27 LAB — HIV ANTIBODY (ROUTINE TESTING W REFLEX): HIV Screen 4th Generation wRfx: NONREACTIVE

## 2021-10-27 LAB — CULTURE, GROUP A STREP (THRC)

## 2021-10-27 LAB — RPR: RPR Ser Ql: NONREACTIVE

## 2021-10-28 ENCOUNTER — Ambulatory Visit (INDEPENDENT_AMBULATORY_CARE_PROVIDER_SITE_OTHER): Payer: 59 | Admitting: Nurse Practitioner

## 2021-10-28 ENCOUNTER — Encounter: Payer: Self-pay | Admitting: Nurse Practitioner

## 2021-10-28 VITALS — BP 128/76 | HR 91 | Temp 98.5°F | Ht 61.0 in | Wt 295.0 lb

## 2021-10-28 DIAGNOSIS — Z0000 Encounter for general adult medical examination without abnormal findings: Secondary | ICD-10-CM | POA: Diagnosis not present

## 2021-10-28 DIAGNOSIS — Z1231 Encounter for screening mammogram for malignant neoplasm of breast: Secondary | ICD-10-CM

## 2021-10-28 DIAGNOSIS — E559 Vitamin D deficiency, unspecified: Secondary | ICD-10-CM | POA: Diagnosis not present

## 2021-10-28 DIAGNOSIS — F329 Major depressive disorder, single episode, unspecified: Secondary | ICD-10-CM | POA: Diagnosis not present

## 2021-10-28 DIAGNOSIS — N6314 Unspecified lump in the right breast, lower inner quadrant: Secondary | ICD-10-CM | POA: Diagnosis not present

## 2021-10-28 DIAGNOSIS — Z6841 Body Mass Index (BMI) 40.0 and over, adult: Secondary | ICD-10-CM

## 2021-10-28 NOTE — Patient Instructions (Signed)

## 2021-10-28 NOTE — Progress Notes (Signed)
I,Tianna Badgett,acting as a Neurosurgeon for [employer], FNP.,have documented all relevant documentation on the behalf of Arnette Felts, FNP,as directed by  Arnette Felts, FNP while in the presence of Arnette Felts, FNP.  Subjective:     Patient ID: Crystal Melton , female    DOB: April 10, 1986 , 36 y.o.   MRN: [id number]   Chief Complaint  Patient presents with   Annual Exam    HPI  Patient presents today for HM. She is being followed by Dr. Vickey Huger for her Pseudotumor cerebri and Ophthalmology Dr. Randon Goldsmith [facility]) she was cleared recently continues with Diamox 2 times a day. She has her GYN needs by Dr. Ernestina Penna  She has started the gardasil vaccine with gyn 1st dose given and go back in 2 months for 2nd dose     Past Medical History:  Diagnosis Date   Pseudotumor      Family History  Problem Relation Age of Onset   Diabetes Mother    Hypertension Mother    Diabetes Father    Hypertension Father    COPD Father    Heart disease Brother    Diabetes Maternal Grandmother    Glaucoma Paternal Grandfather      Current Outpatient Medications:    acetaminophen (TYLENOL) 325 MG tablet, Take 2 tablets (650 mg total) by mouth every 6 (six) hours as needed for moderate pain or headache., Disp: , Rfl:    acetaZOLAMIDE ER (DIAMOX) 500 MG capsule, Take 1 capsule (500 mg total) by mouth 2 (two) times daily. Please call and make overdue appt for further refills, 1st attempt, Disp: 180 capsule, Rfl: 0   cetirizine (ZYRTEC ALLERGY) 10 MG tablet, Take 1 tablet (10 mg total) by mouth daily., Disp: 30 tablet, Rfl: 0   doxycycline (VIBRAMYCIN) 100 MG capsule, Take 1 capsule (100 mg total) by mouth 2 (two) times daily., Disp: 14 capsule, Rfl: 0   metroNIDAZOLE (FLAGYL) 500 MG tablet, Take 1 tablet (500 mg total) by mouth 2 (two) times daily., Disp: 14 tablet, Rfl: 0   phentermine (ADIPEX-P) 37.5 MG tablet, Take 37.5 mg by mouth daily., Disp: , Rfl:    promethazine-dextromethorphan  (PROMETHAZINE-DM) 6.25-15 MG/5ML syrup, Take 5 mLs by mouth 3 (three) times daily as needed for cough., Disp: 100 mL, Rfl: 0   pseudoephedrine (SUDAFED) 60 MG tablet, Take 1 tablet (60 mg total) by mouth every 8 (eight) hours as needed for congestion., Disp: 30 tablet, Rfl: 0   Vitamin D, Ergocalciferol, (DRISDOL) 1.25 MG (50000 UNIT) CAPS capsule, Take 1 capsule two times a week on Fridays, Disp: 24 capsule, Rfl: 0   Allergies  Allergen Reactions   Penicillins Swelling      The patient states she uses IUD for birth control.  Patient's last menstrual period was 10/15/2021.. Negative for Dysmenorrhea and Negative for Menorrhagia. Negative for: breast discharge, breast lump(s), breast pain and breast self exam. Associated symptoms include abnormal vaginal bleeding. Pertinent negatives include abnormal bleeding (hematology), anxiety, decreased libido, depression, difficulty falling sleep, dyspareunia, history of infertility, nocturia, sexual dysfunction, sleep disturbances, urinary incontinence, urinary urgency, vaginal discharge and vaginal itching. Diet regular; she has cut back on her carbs. She is taking Adipex - started May 22nd and has lost 20 lbs.The patient states her exercise level is minimal but walks "a lot" at work. 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 The patient's tobacco use is:  Social History   Tobacco Use  Smoking Status Never  Smokeless Tobacco Never   She has been exposed to passive smoke. The patient's alcohol use is:  Social History   Substance and Sexual Activity  Alcohol Use Yes   Comment: occ   Additional information: Last pap 03/18/2020, next one scheduled for 03/19/2023.    Review of Systems  Constitutional: Negative.   HENT: Negative.    Eyes: Negative.   Respiratory: Negative.    Cardiovascular: Negative.   Gastrointestinal:  Negative for abdominal pain.  Endocrine: Negative.    Genitourinary: Negative.   Musculoskeletal: Negative.   Skin: Negative.   Allergic/Immunologic: Negative.   Neurological: Negative.   Hematological: Negative.   Psychiatric/Behavioral: Negative.       Today's Vitals   10/28/21 1214  BP: 128/76  Pulse: 91  Temp: 98.5 F (36.9 C)  TempSrc: Oral  Weight: 295 lb (133.8 kg)  Height: 5\' 1"  (1.549 m)   Body mass index is 55.74 kg/m.  Wt Readings from Last 3 Encounters:  10/28/21 295 lb (133.8 kg)  10/07/20 (!) 310 lb 12.8 oz (141 kg)  09/23/20 (!) 314 lb (142.4 kg)    Objective:  Physical Exam Constitutional:      General: She is not in acute distress.    Appearance: Normal appearance. She is well-developed. She is obese.  HENT:     Head: Normocephalic and atraumatic.     Right Ear: Hearing, tympanic membrane, ear canal and external ear normal. There is no impacted cerumen.     Left Ear: Hearing, tympanic membrane, ear canal and external ear normal. There is no impacted cerumen.     Nose:     Comments: Deferred - masked    Mouth/Throat:     Comments: Deferred - masked Eyes:     General: Lids are normal.     Extraocular Movements: Extraocular movements intact.     Conjunctiva/sclera: Conjunctivae normal.     Pupils: Pupils are equal, round, and reactive to light.     Funduscopic exam:    Right eye: No papilledema.        Left eye: No papilledema.  Neck:     Thyroid: No thyroid mass.     Vascular: No carotid bruit.  Cardiovascular:     Rate and Rhythm: Normal rate and regular rhythm.     Pulses: Normal pulses.     Heart sounds: Normal heart sounds. No murmur heard. Pulmonary:     Effort: Pulmonary effort is normal. No respiratory distress.     Breath sounds: Normal breath sounds. No wheezing.  Chest:     Chest wall: No mass.  Breasts:    Tanner Score is 5.     Right: Mass (right lower inner breast) present. No tenderness.     Left: Normal. No mass or tenderness.  Abdominal:     General: Abdomen is flat. Bowel  sounds are normal. There is no distension.     Palpations: Abdomen is soft.     Tenderness: There is no abdominal tenderness.  Genitourinary:    Rectum: Guaiac result negative.  Musculoskeletal:        General: No swelling or tenderness. Normal range of motion.     Cervical back: Full passive range of motion without pain, normal range of motion and neck supple.     Right lower leg: No edema.     Left lower leg: No edema.  Lymphadenopathy:     Upper Body:     Right upper body: No supraclavicular, axillary or  pectoral adenopathy.     Left upper body: No supraclavicular, axillary or pectoral adenopathy.  Skin:    General: Skin is warm and dry.     Capillary Refill: Capillary refill takes less than 2 seconds.  Neurological:     General: No focal deficit present.     Mental Status: She is alert and oriented to person, place, and time.     Cranial Nerves: No cranial nerve deficit.     Sensory: No sensory deficit.  Psychiatric:        Mood and Affect: Mood normal.        Behavior: Behavior normal.        Thought Content: Thought content normal.        Judgment: Judgment normal.         Assessment And Plan:     1. Encounter for annual physical exam Behavior modifications discussed and diet history reviewed.   Pt will continue to exercise regularly and modify diet with low GI, plant based foods and decrease intake of processed foods.  Recommend intake of daily multivitamin, Vitamin D, and calcium.  Recommend for preventive screenings, as well as recommend immunizations that include influenza, TDAP, and Shingles - CBC - Lipid panel  2. Encounter for screening mammogram for breast cancer - MM DIGITAL SCREENING BILATERAL; Future  3. Class 3 severe obesity due to excess calories without serious comorbidity with body mass index (BMI) of 50.0 to 59.9 in adult [facility]) She is encouraged to strive for BMI less than 30 to decrease cardiac risk. Advised to aim for at least 150 minutes of  exercise per week.  4. Vitamin D deficiency Will check vitamin D level and supplement as needed.    Also encouraged to spend 15 minutes in the sun daily.  - VITAMIN D 25 Hydroxy (Vit-D Deficiency, Fractures)  5. Mass of lower inner quadrant of right breast Comments: Firm mass palpated to right lower inner quadrant  6. Reactive depression Comments: Would like referral to therapy  - Ambulatory referral to Psychology      Patient was given opportunity to ask questions. Patient verbalized understanding of the plan and was able to repeat key elements of the plan. All questions were answered to their satisfaction.   Arnette Felts, FNP   I, Arnette Felts, FNP, have reviewed all documentation for this visit. The documentation on 10/28/21 for the exam, diagnosis, procedures, and orders are all accurate and complete.   THE PATIENT IS ENCOURAGED TO PRACTICE SOCIAL DISTANCING DUE TO THE COVID-19 PANDEMIC.

## 2021-10-29 ENCOUNTER — Encounter: Payer: Self-pay | Admitting: Nurse Practitioner

## 2021-10-29 LAB — LIPID PANEL
Chol/HDL Ratio: 4.5 ratio — ABNORMAL HIGH (ref 0.0–4.4)
Cholesterol, Total: 150 mg/dL (ref 100–199)
HDL: 33 mg/dL — ABNORMAL LOW (ref >39)
LDL Chol Calc (NIH): 95 mg/dL (ref 0–99)
Triglycerides: 122 mg/dL (ref 0–149)
VLDL Cholesterol Cal: 22 mg/dL (ref 5–40)

## 2021-10-29 LAB — CBC
Hematocrit: 42.3 % (ref 34.0–46.6)
Hemoglobin: 13.3 g/dL (ref 11.1–15.9)
MCH: 28.1 pg (ref 26.6–33.0)
MCHC: 31.4 g/dL — ABNORMAL LOW (ref 31.5–35.7)
MCV: 89 fL (ref 79–97)
Platelets: 339 10*3/uL (ref 150–450)
RBC: 4.73 x10E6/uL (ref 3.77–5.28)
RDW: 12.2 % (ref 11.7–15.4)
WBC: 8.8 10*3/uL (ref 3.4–10.8)

## 2021-10-29 LAB — VITAMIN D 25 HYDROXY (VIT D DEFICIENCY, FRACTURES): Vit D, 25-Hydroxy: 23.1 ng/mL — ABNORMAL LOW (ref 30.0–100.0)

## 2021-11-01 ENCOUNTER — Other Ambulatory Visit: Payer: Self-pay | Admitting: Nurse Practitioner

## 2021-11-01 DIAGNOSIS — E559 Vitamin D deficiency, unspecified: Secondary | ICD-10-CM

## 2021-11-01 MED ORDER — VITAMIN D (ERGOCALCIFEROL) 1.25 MG (50000 UNIT) PO CAPS: ORAL_CAPSULE | ORAL | 0 refills | Status: DC

## 2021-11-03 ENCOUNTER — Other Ambulatory Visit: Payer: Self-pay | Admitting: Nurse Practitioner

## 2021-11-03 DIAGNOSIS — N6314 Unspecified lump in the right breast, lower inner quadrant: Secondary | ICD-10-CM

## 2021-11-17 ENCOUNTER — Other Ambulatory Visit: Payer: Self-pay | Admitting: Nurse Practitioner

## 2021-11-17 ENCOUNTER — Ambulatory Visit
Admission: RE | Admit: 2021-11-17 | Discharge: 2021-11-17 | Disposition: A | Discharge status: Home / Self Care | Payer: 59 | Source: Ambulatory Visit | Attending: Nurse Practitioner | Admitting: Nurse Practitioner

## 2021-11-17 DIAGNOSIS — N6314 Unspecified lump in the right breast, lower inner quadrant: Secondary | ICD-10-CM

## 2021-11-17 DIAGNOSIS — N6320 Unspecified lump in the left breast, unspecified quadrant: Secondary | ICD-10-CM

## 2021-12-01 ENCOUNTER — Ambulatory Visit
Admission: EM | Admit: 2021-12-01 | Discharge: 2021-12-01 | Disposition: A | Discharge status: Home / Self Care | Payer: 59 | Attending: Family Medicine | Admitting: Family Medicine

## 2021-12-01 ENCOUNTER — Encounter: Payer: Self-pay | Admitting: Emergency Medicine

## 2021-12-01 ENCOUNTER — Other Ambulatory Visit: Payer: Self-pay

## 2021-12-01 DIAGNOSIS — L309 Dermatitis, unspecified: Secondary | ICD-10-CM | POA: Diagnosis not present

## 2021-12-01 DIAGNOSIS — Z113 Encounter for screening for infections with a predominantly sexual mode of transmission: Secondary | ICD-10-CM | POA: Insufficient documentation

## 2021-12-01 MED ORDER — HYDROCORTISONE 1 % EX OINT: 1.0000 | TOPICAL_OINTMENT | Freq: Two times a day (BID) | CUTANEOUS | 0 refills | Status: DC | PRN

## 2021-12-01 NOTE — ED Provider Notes (Signed)
[facility]    CSN: [id number] Arrival date & time: 12/01/21  1405      History   Chief Complaint Chief Complaint  Patient presents with   SEXUALLY TRANSMITTED DISEASE    Testing and allergic reaction - Entered by patient    HPI Crystal Melton is a 36 y.o. female.   Presenting today with about a week of facial redness, itching, bumps that appear throughout the day but worse at nighttime.  Denies any new self-care products, detergents, foods, medications or supplements.  She denies any throat itching or swelling, shortness of breath, nausea, vomiting, diarrhea.  Not trying anything over-the-counter for symptoms apart from switching to more gentle products.  She is also requesting STD retesting as she tested positive for trichomoniasis back in June, completed Flagyl course.  No new sexual partners, asymptomatic currently.    Past Medical History:  Diagnosis Date   Pseudotumor     Patient Active Problem List   Diagnosis Date Noted   Class 3 obesity (HCC) 09/17/2020   Essential hypertension 09/17/2020   Pseudotumor cerebri 09/17/2020   Blurred vision, right eye 09/16/2020   Vision loss 09/15/2020    Past Surgical History:  Procedure Laterality Date   CHOLECYSTECTOMY      OB History   No obstetric history on file.      Home Medications    Prior to Admission medications   Medication Sig Start Date End Date Taking? Authorizing Provider  hydrocortisone 1 % ointment Apply 1 Application topically 2 (two) times daily as needed for itching. 12/01/21  Yes Particia Nearing, PA-C  acetaminophen (TYLENOL) 325 MG tablet Take 2 tablets (650 mg total) by mouth every 6 (six) hours as needed for moderate pain or headache. 09/17/20   Arrien, York Ram, MD  acetaZOLAMIDE ER (DIAMOX) 500 MG capsule Take 1 capsule (500 mg total) by mouth 2 (two) times daily. Please call and make overdue appt for further refills, 1st attempt 09/08/21   Dohmeier, Porfirio Mylar, MD   cetirizine (ZYRTEC ALLERGY) 10 MG tablet Take 1 tablet (10 mg total) by mouth daily. 10/24/21   Wallis Bamberg, PA-C  doxycycline (VIBRAMYCIN) 100 MG capsule Take 1 capsule (100 mg total) by mouth 2 (two) times daily. 10/24/21   Wallis Bamberg, PA-C  metroNIDAZOLE (FLAGYL) 500 MG tablet Take 1 tablet (500 mg total) by mouth 2 (two) times daily. 10/26/21   Merrilee Jansky, MD  phentermine (ADIPEX-P) 37.5 MG tablet Take 37.5 mg by mouth daily. 10/21/21   [provider]  promethazine-dextromethorphan (PROMETHAZINE-DM) 6.25-15 MG/5ML syrup Take 5 mLs by mouth 3 (three) times daily as needed for cough. 10/24/21   Wallis Bamberg, PA-C  pseudoephedrine (SUDAFED) 60 MG tablet Take 1 tablet (60 mg total) by mouth every 8 (eight) hours as needed for congestion. 10/24/21   Wallis Bamberg, PA-C  Vitamin D, Ergocalciferol, (DRISDOL) 1.25 MG (50000 UNIT) CAPS capsule Take 1 capsule two times a week on Fridays 11/01/21   Arnette Felts, FNP    Family History Family History  Problem Relation Age of Onset   Diabetes Mother    Hypertension Mother    Diabetes Father    Hypertension Father    COPD Father    Heart disease Brother    Diabetes Maternal Grandmother    Glaucoma Paternal Grandfather     Social History Social History   Tobacco Use   Smoking status: Never   Smokeless tobacco: Never  Vaping Use   Vaping Use: Never used  Substance  Use Topics   Alcohol use: Yes    Comment: occ   Drug use: Never     Allergies   Penicillins   Review of Systems Review of Systems Per HPI  Physical Exam Triage Vital Signs ED Triage Vitals [12/01/21 1424]  Enc Vitals Group     BP (!) 147/84     Pulse Rate 99     Resp 20     Temp 98.3 F (36.8 C)     Temp Source Oral     SpO2 98 %     Weight      Height      Head Circumference      Peak Flow      Pain Score 0     Pain Loc      Pain Edu?      Excl. in GC?    No data found.  Updated Vital Signs BP (!) 147/84 (BP Location: Right Arm)   Pulse 99    Temp 98.3 F (36.8 C) (Oral)   Resp 20   SpO2 98%   Visual Acuity Right Eye Distance:   Left Eye Distance:   Bilateral Distance:    Right Eye Near:   Left Eye Near:    Bilateral Near:     Physical Exam Vitals and nursing note reviewed.  Constitutional:      Appearance: Normal appearance. She is not ill-appearing.  HENT:     Head: Atraumatic.     Mouth/Throat:     Mouth: Mucous membranes are moist.  Eyes:     Extraocular Movements: Extraocular movements intact.     Conjunctiva/sclera: Conjunctivae normal.  Cardiovascular:     Rate and Rhythm: Normal rate and regular rhythm.     Heart sounds: Normal heart sounds.  Pulmonary:     Effort: Pulmonary effort is normal.     Breath sounds: Normal breath sounds.  Genitourinary:    Comments: GU exam deferred, self swab performed Musculoskeletal:        General: Normal range of motion.     Cervical back: Normal range of motion and neck supple.  Skin:    General: Skin is warm.     Findings: Rash present.     Comments: Slight erythema to face diffusely, scattered papules  Neurological:     Mental Status: She is alert and oriented to person, place, and time.  Psychiatric:        Mood and Affect: Mood normal.        Thought Content: Thought content normal.        Judgment: Judgment normal.      UC Treatments / Results  Labs (all labs ordered are listed, but only abnormal results are displayed) Labs Reviewed  CERVICOVAGINAL ANCILLARY ONLY    EKG   Radiology No results found.  Procedures Procedures (including critical care time)  Medications Ordered in UC Medications - No data to display  Initial Impression / Assessment and Plan / UC Course  I have reviewed the triage vital signs and the nursing notes.  Pertinent labs & imaging results that were available during my care of the patient were reviewed by me and considered in my medical decision making (see chart for details).     Try hydrocortisone ointment  mixed with coconut oil or other unscented moisturizer twice a day to the face, avoid any potential allergenic products, follow-up with PCP for recheck.  Vaginal swab pending to ensure clearance of trichomoniasis per patient request.  Discussed safe  sexual practices, good vaginal hygiene.  Return for worsening symptoms.  Final Clinical Impressions(s) / UC Diagnoses   Final diagnoses:  Facial dermatitis  Routine screening for STI (sexually transmitted infection)   Discharge Instructions   None    ED Prescriptions     Medication Sig Dispense Auth. Provider   hydrocortisone 1 % ointment Apply 1 Application topically 2 (two) times daily as needed for itching. 80 g Particia Nearing, [place]      PDMP not reviewed this encounter.   Particia Nearing, [place] 12/01/21 1451

## 2021-12-01 NOTE — ED Triage Notes (Addendum)
Pt reports facial redness and itching that is worse at night x1 week. Pt denies any shortness of breath, throat swelling, new self care products.  Pt also reports recently tested positive for Trich and would like another "complete panel with swab and blood work" to make sure everything is ok now that abx have been completed.

## 2021-12-02 ENCOUNTER — Ambulatory Visit: Payer: 59 | Admitting: Adult Health

## 2021-12-02 ENCOUNTER — Telehealth (HOSPITAL_COMMUNITY): Payer: Self-pay | Admitting: Emergency Medicine

## 2021-12-02 VITALS — BP 114/72 | HR 90 | Ht 61.0 in | Wt 299.0 lb

## 2021-12-02 DIAGNOSIS — G932 Benign intracranial hypertension: Secondary | ICD-10-CM | POA: Diagnosis not present

## 2021-12-02 LAB — CERVICOVAGINAL ANCILLARY ONLY
Bacterial Vaginitis (gardnerella): POSITIVE — AB
Candida Glabrata: NEGATIVE
Candida Vaginitis: NEGATIVE
Chlamydia: NEGATIVE
Comment: NEGATIVE
Comment: NEGATIVE
Comment: NEGATIVE
Comment: NEGATIVE
Comment: NEGATIVE
Comment: NORMAL
Neisseria Gonorrhea: NEGATIVE
Trichomonas: NEGATIVE

## 2021-12-02 MED ORDER — ACETAZOLAMIDE ER 500 MG PO CP12
500.0000 mg | ORAL_CAPSULE | Freq: Two times a day (BID) | ORAL | 3 refills | Status: DC
Start: 2021-12-02 — End: 2022-12-13

## 2021-12-02 MED ORDER — METRONIDAZOLE 500 MG PO TABS: 500.0000 mg | ORAL_TABLET | Freq: Two times a day (BID) | ORAL | 0 refills | Status: DC

## 2021-12-02 NOTE — Progress Notes (Signed)
PATIENT: Crystal Melton DOB: 06/02/1985  REASON FOR VISIT: follow up HISTORY FROM: patient PRIMARY NEUROLOGIST: Dr. Vickey Huger  Chief Complaint  Patient presents with   Follow-up    Rm 19 alone- reports she is doing well. No concerns.      HISTORY OF PRESENT ILLNESS: Today 12/02/21:  Crystal Melton is a 36 year old female with a history of pseudotumor cerebri.  She returns today for follow-up.  She reports that she has been doing well on Diamox.  Recently had visit with her ophthalmologist who cleared her to come back in 1 year.  Denies headache, muffled hearing or back pain. Reports that she has lost 25 lbs on phentermine.  She returns today for an evaluation.   HISTORY (Copied from Dr.Dohmeier's note) Crystal Melton is a 36 y.o.  African American female patient and seen here upon an ED referral on 09/23/2020 from ED/ Hospitalist.  [name] was diagnosed with pseudotumor cerebri or benign intracranial hypertension she had an MRI during her brief hospitalization also asked by Dr. Toniann Fail.  She was admitted to 5 E. medical unit at the hospital her noncontrast MRI had been performed on 517, it was followed by this contrast study which showed a 9 mm focus of enhancement in the anterior frontal lobe white matter, probably an inborn venous abnormality there was also a small lesion in the paramedian left pons.  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  Suggestion of a septal intraocular protrusion of the right optic nerve head and a partially empty sella to 6 are very suspicious for idiopathic intracranial hypertension-pseudotumor cerebri and I spinal tap had followed.  She also had a little bit of ethmoidal sinusitis.    REVIEW OF SYSTEMS: Out of a complete 14 system review of symptoms, the patient complains only of the following symptoms, and all other reviewed systems are negative.  ALLERGIES: Allergies  Allergen Reactions   Penicillins Swelling     HOME MEDICATIONS: Outpatient Medications Prior to Visit  Medication Sig Dispense Refill   acetaminophen (TYLENOL) 325 MG tablet Take 2 tablets (650 mg total) by mouth every 6 (six) hours as needed for moderate pain or headache.     acetaZOLAMIDE ER (DIAMOX) 500 MG capsule Take 1 capsule (500 mg total) by mouth 2 (two) times daily. Please call and make overdue appt for further refills, 1st attempt 180 capsule 0   cetirizine (ZYRTEC ALLERGY) 10 MG tablet Take 1 tablet (10 mg total) by mouth daily. 30 tablet 0   doxycycline (VIBRAMYCIN) 100 MG capsule Take 1 capsule (100 mg total) by mouth 2 (two) times daily. 14 capsule 0   hydrocortisone 1 % ointment Apply 1 Application topically 2 (two) times daily as needed for itching. 80 g 0   phentermine (ADIPEX-P) 37.5 MG tablet Take 37.5 mg by mouth daily.     promethazine-dextromethorphan (PROMETHAZINE-DM) 6.25-15 MG/5ML syrup Take 5 mLs by mouth 3 (three) times daily as needed for cough. 100 mL 0   pseudoephedrine (SUDAFED) 60 MG tablet Take 1 tablet (60 mg total) by mouth every 8 (eight) hours as needed for congestion. 30 tablet 0   Vitamin D, Ergocalciferol, (DRISDOL) 1.25 MG (50000 UNIT) CAPS capsule Take 1 capsule two times a week on Fridays 24 capsule 0   metroNIDAZOLE (FLAGYL) 500 MG tablet Take 1 tablet (500 mg total) by mouth 2 (two) times daily. 14 tablet 0   No facility-administered medications prior to visit.    PAST MEDICAL HISTORY: Past Medical History:  Diagnosis Date   Pseudotumor     PAST SURGICAL HISTORY: Past Surgical History:  Procedure Laterality Date   CHOLECYSTECTOMY      FAMILY HISTORY: Family History  Problem Relation Age of Onset   Diabetes Mother    Hypertension Mother    Diabetes Father    Hypertension Father    COPD Father    Heart disease Brother    Diabetes Maternal Grandmother    Glaucoma Paternal Grandfather     SOCIAL HISTORY: Social History   Socioeconomic History   Marital status: Single     Spouse name: Not on file   Number of children: Not on file   Years of education: Not on file   Highest education level: Not on file  Occupational History   Not on file  Tobacco Use   Smoking status: Never   Smokeless tobacco: Never  Vaping Use   Vaping Use: Never used  Substance and Sexual Activity   Alcohol use: Yes    Comment: occ   Drug use: Never   Sexual activity: Yes    Birth control/protection: I.U.D.  Other Topics Concern   Not on file  Social History Narrative   Not on file   Social Determinants of Health   Financial Resource Strain: Not on file  Food Insecurity: Not on file  Transportation Needs: Not on file  Physical Activity: Not on file  Stress: Not on file  Social Connections: Not on file  Intimate Partner Violence: Not on file      PHYSICAL EXAM  Vitals:   12/02/21 1052  BP: 114/72  Pulse: 90  SpO2: 99%  Weight: 299 lb (135.6 kg)  Height: 5\' 1"  (1.549 m)   Body mass index is 56.5 kg/m.  Generalized: Well developed, in no acute distress   Neurological examination  Mentation: Alert oriented to time, place, history taking. Follows all commands speech and language fluent Cranial nerve II-XII: Pupils were equal round reactive to light. Extraocular movements were full, visual field were full on confrontational test. Facial sensation and strength were normal.  Head turning and shoulder shrug  were normal and symmetric. Motor: The motor testing reveals 5 over 5 strength of all 4 extremities. Good symmetric motor tone is noted throughout.  Sensory: Sensory testing is intact to soft touch on all 4 extremities. No evidence of extinction is noted.  Coordination: Cerebellar testing reveals good finger-nose-finger and heel-to-shin bilaterally.  Gait and station: Gait is normal.  Reflexes: Deep tendon reflexes are symmetric and normal bilaterally.   DIAGNOSTIC DATA (LABS, IMAGING, TESTING) - I reviewed patient records, labs, notes, testing and imaging  myself where available.  Lab Results  Component Value Date   WBC 8.8 10/28/2021   HGB 13.3 10/28/2021   HCT 42.3 10/28/2021   MCV 89 10/28/2021   PLT 339 10/28/2021      Component Value Date/Time   NA 141 10/07/2020 1239   K 4.2 10/07/2020 1239   CL 108 (H) 10/07/2020 1239   CO2 17 (L) 10/07/2020 1239   GLUCOSE 85 10/07/2020 1239   GLUCOSE 85 09/16/2020 0501   BUN 10 10/07/2020 1239   CREATININE 0.96 10/07/2020 1239   CALCIUM 9.2 10/07/2020 1239   PROT 7.6 10/07/2020 1239   ALBUMIN 4.6 10/07/2020 1239   AST 14 10/07/2020 1239   ALT 28 10/07/2020 1239   ALKPHOS 75 10/07/2020 1239   BILITOT 0.3 10/07/2020 1239   GFRNONAA >60 09/16/2020 0501   Lab Results  Component Value Date  CHOL 150 10/28/2021   HDL 33 (L) 10/28/2021   LDLCALC 95 10/28/2021   TRIG 122 10/28/2021   CHOLHDL 4.5 (H) 10/28/2021   Lab Results  Component Value Date   HGBA1C 5.2 10/07/2020    Lab Results  Component Value Date   TSH 1.090 09/16/2020      ASSESSMENT AND PLAN 36 y.o. year old female  has a past medical history of Pseudotumor. here with:  1.  Pseudotumor cerebri  -Continue Diamox 500 mg twice a day -Keep regular follow-ups with ophthalmology -Encouraged the patient to continue with weight loss and diet management -Also advised that she should avoid tetracyclines -Follow-up with our office in 1 year or sooner if needed     Butch Penny, MSN, NP-C 12/02/2021, 11:05 AM [facility] [address] [place], [place] [address] [phone]

## 2021-12-02 NOTE — Patient Instructions (Signed)
Your Plan:  Continue Diamox 500 mg twice a day  Keep regular eye exams If your symptoms worsen or you develop new symptoms please let us know.       Thank you for coming to see [place] at [facility]. I hope we have been able to provide you high quality care today.  You may receive a patient satisfaction survey over the next few weeks. We would appreciate your feedback and comments so that we may continue to improve ourselves and the health of our patients.

## 2022-01-12 ENCOUNTER — Ambulatory Visit (INDEPENDENT_AMBULATORY_CARE_PROVIDER_SITE_OTHER): Payer: 59 | Admitting: Behavioral Health

## 2022-01-12 DIAGNOSIS — F4323 Adjustment disorder with mixed anxiety and depressed mood: Secondary | ICD-10-CM

## 2022-01-12 NOTE — Progress Notes (Addendum)
[facility] Counselor Initial Adult Exam  Name: Crystal Melton Date: 01/12/2022 MRN: [id number] DOB: 01-16-1986 PCP: Arnette Felts, FNP  Time spent: 60 min In Person visit @ [facility]  Guardian/Payee:  Self    Paperwork requested: No   Reason for Visit /Presenting Problem: Pt has been in a highly disorganized & chaotic envir in her job as an Asst. 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 She has concluded she cannot cont in this position due to the GM's beh of being absent constantly & all the functioning being on her shoulders. It is now causing her anx/dep, although she stays upbeat.  Pt reports she is an emot'l eater & consumes unhealthy food. She stays in bed @ home, dreads going back to work after her inconsistent 2d/off per wk & her cats get no attn now. Pt is stressed & has a POA.  Mental Status Exam: Appearance:   Neat     Behavior:  Appropriate and Sharing  Motor:  Normal  Speech/Language:   Normal Rate  Affect:  Appropriate  Mood:  anxious   Thought process:  normal  Thought content:    WNL  Sensory/Perceptual disturbances:    WNL  Orientation:  oriented to person, place, and time/date  Attention:  Good  Concentration:  Good  Memory:  WNL  Fund of knowledge:   Good  Insight:    Good  Judgment:   Good  Impulse Control:  Good    Risk Assessment: Danger to Self:  No Self-injurious Behavior: No Danger to Others: No Duty to Warn:no Physical Aggression / Violence:No  Access to Firearms a concern: No  Gang Involvement:No  Patient / guardian was educated about steps to take if suicide or homicide risk level increases between visits: yes; Pt provided appropriate resources While future psychiatric events cannot be accurately predicted, the patient does not currently require acute inpatient psychiatric care and does not currently meet [facility] involuntary commitment criteria.  Substance Abuse History: Current substance abuse: No     Past Psychiatric  History:   No previous psychological problems have been observed Outpatient Providers:Janece Christell Constant, FNP History of Psych Hospitalization: No  Psychological Testing:  NA    Abuse History:  Victim of: No.,  NA    Report needed: No. Victim of Neglect:No. Perpetrator of  NA   Witness / Exposure to Domestic Violence: No   Protective Services Involvement: No  Witness to [employer] Violence:  No   Family History:  Family History  Problem Relation Age of Onset   Diabetes Mother    Hypertension Mother    Diabetes Father    Hypertension Father    COPD Father    Heart disease Brother    Diabetes Maternal Grandmother    Glaucoma Paternal Grandfather     Living situation: the patient lives alone  Sexual Orientation: Straight  Relationship Status: single  Name of spouse / other:NA If a parent, number of children / ages:NA  Support Systems: friends parents Siblings  Financial Stress:  No   Income/Employment/Disability: Employment @ [employer] as an Asst. Theatre stage manager: No   Educational History: Education: college graduate  Religion/Sprituality/World View: Unk  Any cultural differences that may affect / interfere with treatment:  None noted  Recreation/Hobbies: Pt has been "lazy" lately & does not do much. She is stressed & exhausted by her work.   Stressors: Occupational concerns    Strengths: Family, Journalist, newspaper, and Able to Communicate Effectively  Barriers:  None noted   Legal History: Pending legal issue / charges: The patient has no significant history of legal issues. History of legal issue / charges:  NA  Medical History/Surgical History: reviewed Past Medical History:  Diagnosis Date   Pseudotumor     Past Surgical History:  Procedure Laterality Date   CHOLECYSTECTOMY      Medications: Current Outpatient Medications  Medication Sig Dispense Refill   acetaminophen (TYLENOL) 325 MG tablet Take 2 tablets (650 mg total) by mouth  every 6 (six) hours as needed for moderate pain or headache.     acetaZOLAMIDE ER (DIAMOX) 500 MG capsule Take 1 capsule (500 mg total) by mouth 2 (two) times daily. Please call and make overdue appt for further refills, 1st attempt 180 capsule 3   cetirizine (ZYRTEC ALLERGY) 10 MG tablet Take 1 tablet (10 mg total) by mouth daily. 30 tablet 0   doxycycline (VIBRAMYCIN) 100 MG capsule Take 1 capsule (100 mg total) by mouth 2 (two) times daily. (Patient not taking: Reported on 12/02/2021) 14 capsule 0   hydrocortisone 1 % ointment Apply 1 Application topically 2 (two) times daily as needed for itching. 80 g 0   metroNIDAZOLE (FLAGYL) 500 MG tablet Take 1 tablet (500 mg total) by mouth 2 (two) times daily. 14 tablet 0   phentermine (ADIPEX-P) 37.5 MG tablet Take 37.5 mg by mouth daily.     promethazine-dextromethorphan (PROMETHAZINE-DM) 6.25-15 MG/5ML syrup Take 5 mLs by mouth 3 (three) times daily as needed for cough. 100 mL 0   pseudoephedrine (SUDAFED) 60 MG tablet Take 1 tablet (60 mg total) by mouth every 8 (eight) hours as needed for congestion. 30 tablet 0   Vitamin D, Ergocalciferol, (DRISDOL) 1.25 MG (50000 UNIT) CAPS capsule Take 1 capsule two times a week on Fridays 24 capsule 0   No current facility-administered medications for this visit.    Allergies  Allergen Reactions   Penicillins Swelling    Diagnoses:  Adjustment disorder with mixed anxiety and depressed mood  Plan of Care: ST: Pt will begin to record in a notebook btwn sessions to process her frustration & write about her upcoming Letter to the [employer] for the [place]. She will give her Notice if they deny her claims & change the current way the [facility] is run. We will review the results of her Letter next session.   LT: Pt will inquire w/her PCP about medication for her anx/dep to help her improve coping along w/psychotherapy. We will discuss next session.    Deneise Lever, LMFT

## 2022-01-12 NOTE — Addendum Note (Signed)
Addended by: Deneise Lever on: 01/12/2022 05:31 PM   Modules accepted: Level of Service

## 2022-01-12 NOTE — Progress Notes (Signed)
                Hiilani Jetter L Oluwadarasimi Favor, LMFT 

## 2022-01-18 ENCOUNTER — Other Ambulatory Visit: Payer: Self-pay | Admitting: Nurse Practitioner

## 2022-01-18 DIAGNOSIS — E559 Vitamin D deficiency, unspecified: Secondary | ICD-10-CM

## 2022-01-21 ENCOUNTER — Encounter: Payer: Self-pay | Admitting: Nurse Practitioner

## 2022-02-01 ENCOUNTER — Encounter: Payer: Self-pay | Admitting: Nurse Practitioner

## 2022-02-01 ENCOUNTER — Ambulatory Visit (INDEPENDENT_AMBULATORY_CARE_PROVIDER_SITE_OTHER): Payer: 59 | Admitting: Nurse Practitioner

## 2022-02-01 VITALS — BP 118/74 | HR 95 | Temp 98.6°F | Ht 61.0 in | Wt 293.8 lb

## 2022-02-01 DIAGNOSIS — F329 Major depressive disorder, single episode, unspecified: Secondary | ICD-10-CM

## 2022-02-01 DIAGNOSIS — Z23 Encounter for immunization: Secondary | ICD-10-CM | POA: Diagnosis not present

## 2022-02-01 DIAGNOSIS — Z6841 Body Mass Index (BMI) 40.0 and over, adult: Secondary | ICD-10-CM

## 2022-02-01 DIAGNOSIS — Z139 Encounter for screening, unspecified: Secondary | ICD-10-CM

## 2022-02-01 MED ORDER — BUPROPION HCL ER (XL) 150 MG PO TB24: 150.0000 mg | ORAL_TABLET | ORAL | 2 refills | Status: DC

## 2022-02-01 NOTE — Patient Instructions (Addendum)
Depression Screening Depression screening is a tool that your health care provider can use to learn if you have symptoms of depression. Depression is a common condition with many symptoms that are also often found in other conditions. Depression is treatable, but it must first be diagnosed. You may not know that certain feelings, thoughts, and behaviors that you are having can be symptoms of depression. Taking a depression screening test can help you and your health care provider decide if you need more assessment, or if you should be referred to a mental health care provider. What are the screening tests? You may have a physical exam to see if another condition is affecting your mental health. You may have a blood or urine sample taken during the physical exam. You may be interviewed or offered a written test using a screening tool that was developed from research, such as one of these: Patient Health Questionnaire (PHQ). This is a set of either 2 or 9 questions. A health care provider who has been trained to score this screening test uses a guide to assess if your symptoms suggest that you may have depression. Hamilton Depression Rating Scale (HAM-D). This is a set of either 17 or 24 questions. You may be asked to take it again during or after your treatment, to see if your depression has gotten better. Beck Depression Inventory (BDI). This is a set of 21 multiple choice questions. Your health care provider scores your answers to assess: Your level of depression, ranging from mild to severe. Your response to treatment. Your health care provider may talk with you about your daily activities, such as eating, sleeping, work, and recreation, and ask if you have had any changes in activity. Your health care provider may ask you to see a mental health specialist, such as a psychiatrist or psychologist, for more evaluation. Who should be screened for depression?  All adults, including adults with a family  history of a mental health disorder. People who are 10-21 years old. People who are recovering from an acute condition, such as myocardial infarction (MI) or stroke. Pregnant women, or women who have given birth. People who have a long-term (chronic) illness. Anyone who has been diagnosed with another type of mental health disorder. Anyone who has symptoms that could show depression. What do my results mean? Your health care provider will review the results of your depression screening, physical exam, and lab tests. Positive screens suggest that you may have depression. Screening is the first step in getting the care that you may need. It will be important for you to know the results of your tests. Ask your health care provider, or the department that is doing your screening tests, when your results will be ready. Talk with your health care provider about your results, diagnosis, and recommendations for follow-up. A diagnosis of depression is made using information from the Diagnostic and Statistical Manual of Mental Disorders (DSM-5). This is a book that lists the number and type of symptoms that must be present for a health care provider to give a specific diagnosis. Your health care provider may work with you to treat your symptoms of depression, or your health care provider may help you find a mental health provider who can assess and help you develop a plan to treat your depression. Get help right away if: You have thoughts about hurting yourself or others. If you ever feel like you may hurt yourself or others, or have thoughts about taking your own life,   get help right away. Go to your nearest emergency department or: Call your local emergency services (911 in the [place] Call a suicide crisis helpline, such as the National Suicide Prevention Lifeline at 574-417-7642 or 988 in the [place] This is open 24 hours a day in the [place] Text the Crisis Text Line at 863 452 0427 (in the  [place] Summary Depression screening is the first step in getting the help that you may need. If your screening test shows symptoms of depression (is positive), your health care provider may ask you to see a mental health provider who will help identify ways to treat your depression. Anyone aged 22 or older should be screened for depression. This information is not intended to replace advice given to you by your health care provider. Make sure you discuss any questions you have with your health care provider. Document Revised: 11/11/2020 Document Reviewed: 07/27/2020 Elsevier Patient Education  2023 Elsevier Inc.   Influenza (Flu) Vaccine (Inactivated or Recombinant): What You Need to Know 1. Why get vaccinated? Influenza vaccine can prevent influenza (flu). Flu is a contagious disease that spreads around the [place] every year, usually between October and May. Anyone can get the flu, but it is more dangerous for some people. Infants and young children, people 60 years and older, pregnant people, and people with certain health conditions or a weakened immune system are at greatest risk of flu complications. Pneumonia, bronchitis, sinus infections, and ear infections are examples of flu-related complications. If you have a medical condition, such as heart disease, cancer, or diabetes, flu can make it worse. Flu can cause fever and chills, sore throat, muscle aches, fatigue, cough, headache, and runny or stuffy nose. Some people may have vomiting and diarrhea, though this is more common in children than adults. In an average year, thousands of people in the [place] die from flu, and many more are hospitalized. Flu vaccine prevents millions of illnesses and flu-related visits to the doctor each year. 2. Influenza vaccines CDC recommends everyone 6 months and older get vaccinated every flu season. Children 6 months through 46 years of age may need 2 doses during a single flu season. Everyone  else needs only 1 dose each flu season. It takes about 2 weeks for protection to develop after vaccination. There are many flu viruses, and they are always changing. Each year a new flu vaccine is made to protect against the influenza viruses believed to be likely to cause disease in the upcoming flu season. Even when the vaccine doesn't exactly match these viruses, it may still provide some protection. Influenza vaccine does not cause flu. Influenza vaccine may be given at the same time as other vaccines. 3. Talk with your health care provider Tell your vaccination provider if the person getting the vaccine: Has had an allergic reaction after a previous dose of influenza vaccine, or has any severe, life-threatening allergies Has ever had Guillain-Barr Syndrome (also called "GBS") In some cases, your health care provider may decide to postpone influenza vaccination until a future visit. Influenza vaccine can be administered at any time during pregnancy. People who are or will be pregnant during influenza season should receive inactivated influenza vaccine. People with minor illnesses, such as a cold, may be vaccinated. People who are moderately or severely ill should usually wait until they recover before getting influenza vaccine. Your health care provider can give you more information. 4. Risks of a vaccine reaction Soreness, redness, and swelling where the shot is given, fever, muscle  aches, and headache can happen after influenza vaccination. There may be a very small increased risk of Guillain-Barr Syndrome (GBS) after inactivated influenza vaccine (the flu shot). Young children who get the flu shot along with pneumococcal vaccine (PCV13) and/or DTaP vaccine at the same time might be slightly more likely to have a seizure caused by fever. Tell your health care provider if a child who is getting flu vaccine has ever had a seizure. People sometimes faint after medical procedures, including  vaccination. Tell your provider if you feel dizzy or have vision changes or ringing in the ears. As with any medicine, there is a very remote chance of a vaccine causing a severe allergic reaction, other serious injury, or death. 5. What if there is a serious problem? An allergic reaction could occur after the vaccinated person leaves the clinic. If you see signs of a severe allergic reaction (hives, swelling of the face and throat, difficulty breathing, a fast heartbeat, dizziness, or weakness), call 9-1-1 and get the person to the nearest hospital. For other signs that concern you, call your health care provider. Adverse reactions should be reported to the Vaccine Adverse Event Reporting System (VAERS). Your health care provider will usually file this report, or you can do it yourself. Visit the VAERS website at www.vaers.SamedayNews.es or call 856-759-0498. VAERS is only for reporting reactions, and VAERS staff members do not give medical advice. 6. The National Vaccine Injury Compensation Program The [employer] Vaccine Injury Compensation Program (VICP) is a federal program that was created to compensate people who may have been injured by certain vaccines. Claims regarding alleged injury or death due to vaccination have a time limit for filing, which may be as short as two years. Visit the VICP website at GoldCloset.com.ee or call 956-849-8453 to learn about the program and about filing a claim. 7. How can I learn more? Ask your health care provider. Call your local or state health department. Visit the website of the Food and Drug Administration (FDA) for vaccine package inserts and additional information at TraderRating.uy. Contact the Centers for Disease Control and Prevention (CDC): Call 520-362-4307 (1-800-CDC-INFO) or Visit CDC's website at https://gibson.com/. Source: CDC Vaccine Information Statement Inactivated Influenza Vaccine (12/06/2019) This same  material is available at http://www.wolf.info/ for no charge. This information is not intended to replace advice given to you by your health care provider. Make sure you discuss any questions you have with your health care provider. Document Revised: 03/17/2021 Document Reviewed: 01/07/2021 Elsevier Patient Education  [place].

## 2022-02-01 NOTE — Progress Notes (Signed)
I,Victoria T Hamilton,acting as a Neurosurgeon for Arnette Felts, FNP.,have documented all relevant documentation on the behalf of Arnette Felts, FNP,as directed by  Arnette Felts, FNP while in the presence of Arnette Felts, FNP.    Subjective:     Patient ID: Crystal Melton , female    DOB: 10-25-1985 , 36 y.o.   MRN: [id number]   Chief Complaint  Patient presents with   Depression    HPI  Pt presents today for depression f/u. She had her first visit with a therapist on Sept 13 th, next scheduled for Wednesday.   She reports she does not feel like herself, she feels like is time to start on a medication at a low dose. She does have a family history of depression. She is at a point she would like to lay in bed and go to sleep. She does not feel like it got better at any point and time. Her family/friends say she is quiet.   She would also like to know her blood type.   Depression        This is a chronic problem.  The onset quality is gradual.   Associated symptoms include hopelessness.  Associated symptoms include no decreased concentration and no fatigue.  Past treatments include nothing.  Risk factors include family history.     Past Medical History:  Diagnosis Date   Pseudotumor      Family History  Problem Relation Age of Onset   Diabetes Mother    Hypertension Mother    Diabetes Father    Hypertension Father    COPD Father    Heart disease Brother    Diabetes Maternal Grandmother    Glaucoma Paternal Grandfather      Current Outpatient Medications:    acetaminophen (TYLENOL) 325 MG tablet, Take 2 tablets (650 mg total) by mouth every 6 (six) hours as needed for moderate pain or headache., Disp: , Rfl:    acetaZOLAMIDE ER (DIAMOX) 500 MG capsule, Take 1 capsule (500 mg total) by mouth 2 (two) times daily. Please call and make overdue appt for further refills, 1st attempt, Disp: 180 capsule, Rfl: 3   buPROPion (WELLBUTRIN XL) 150 MG 24 hr tablet, Take 1 tablet (150 mg total)  by mouth every morning., Disp: 30 tablet, Rfl: 2   cetirizine (ZYRTEC ALLERGY) 10 MG tablet, Take 1 tablet (10 mg total) by mouth daily., Disp: 30 tablet, Rfl: 0   phentermine (ADIPEX-P) 37.5 MG tablet, Take 37.5 mg by mouth daily., Disp: , Rfl:    Vitamin D, Ergocalciferol, (DRISDOL) 1.25 MG (50000 UNIT) CAPS capsule, TAKE 1 CAPSULE BY MOUTH TWO TIMES A WEEK, Disp: 24 capsule, Rfl: 0   doxycycline (VIBRAMYCIN) 100 MG capsule, Take 1 capsule (100 mg total) by mouth 2 (two) times daily. (Patient not taking: Reported on 12/02/2021), Disp: 14 capsule, Rfl: 0   hydrocortisone 1 % ointment, Apply 1 Application topically 2 (two) times daily as needed for itching. (Patient not taking: Reported on 02/01/2022), Disp: 80 g, Rfl: 0   pseudoephedrine (SUDAFED) 60 MG tablet, Take 1 tablet (60 mg total) by mouth every 8 (eight) hours as needed for congestion. (Patient not taking: Reported on 02/01/2022), Disp: 30 tablet, Rfl: 0   Allergies  Allergen Reactions   Penicillins Swelling     Review of Systems  Constitutional: Negative.  Negative for fatigue.  Respiratory: Negative.    Cardiovascular: Negative.   Neurological: Negative.   Psychiatric/Behavioral:  Positive for depression. Negative for decreased concentration.  Today's Vitals   02/01/22 0907  BP: 118/74  Pulse: 95  Temp: 98.6 F (37 C)  TempSrc: Oral  SpO2: 98%  Weight: 293 lb 12.8 oz (133.3 kg)  Height: 5\' 1"  (1.549 m)  PainSc: 0-No pain   Body mass index is 55.51 kg/m.  Wt Readings from Last 3 Encounters:  02/01/22 293 lb 12.8 oz (133.3 kg)  12/02/21 299 lb (135.6 kg)  10/28/21 295 lb (133.8 kg)    Objective:  Physical Exam Vitals reviewed.  Constitutional:      General: She is not in acute distress.    Appearance: Normal appearance. She is well-developed. She is obese.  Cardiovascular:     Rate and Rhythm: Normal rate and regular rhythm.     Pulses: Normal pulses.     Heart sounds: Normal heart sounds. No murmur  heard. Pulmonary:     Effort: Pulmonary effort is normal. No respiratory distress.     Breath sounds: Normal breath sounds. No wheezing.  Skin:    General: Skin is warm and dry.     Capillary Refill: Capillary refill takes less than 2 seconds.  Neurological:     General: No focal deficit present.     Mental Status: She is alert and oriented to person, place, and time.     Cranial Nerves: No cranial nerve deficit.  Psychiatric:        Attention and Perception: Attention normal.        Mood and Affect: Mood normal.        Behavior: Behavior normal.        Thought Content: Thought content normal.        Cognition and Memory: Cognition normal.        Judgment: Judgment normal.         Assessment And Plan:     1. Reactive depression Comments: she feels like she is worse. Depression screen is 10, down from 15. Will start on Wellbutrin for 6 months, discussed side effects and to not abruptly stop medication. Continue counseling - buPROPion (WELLBUTRIN XL) 150 MG 24 hr tablet; Take 1 tablet (150 mg total) by mouth every morning.  Dispense: 30 tablet; Refill: 2  2. Need for influenza vaccination Influenza vaccine administered Encouraged to take Tylenol as needed for fever or muscle aches. - Flu Vaccine QUAD 6+ mos PF IM (Fluarix Quad PF)  3. Class 3 severe obesity due to excess calories without serious comorbidity with body mass index (BMI) of 50.0 to 59.9 in adult [facility]) Comments: Continue focusing on healthy diet. She is on Adipex with her GYN. She is encouraged to strive for BMI less than 30 to decrease cardiac risk. Advised to aim for at least 150 minutes of exercise per week.  4. Encounter for screening Comments: Interested in knowing her blood type - ABO AND RH     Patient was given opportunity to ask questions. Patient verbalized understanding of the plan and was able to repeat key elements of the plan. All questions were answered to their satisfaction.  Minette Brine, FNP    I, Minette Brine, FNP, have reviewed all documentation for this visit. The documentation on 02/01/22 for the exam, diagnosis, procedures, and orders are all accurate and complete.   IF YOU HAVE BEEN REFERRED TO A SPECIALIST, IT MAY TAKE 1-2 WEEKS TO SCHEDULE/PROCESS THE REFERRAL. IF YOU HAVE NOT HEARD FROM US/SPECIALIST IN TWO WEEKS, PLEASE GIVE [place] A CALL AT [phone] X [phone].   THE PATIENT IS ENCOURAGED TO  PRACTICE SOCIAL DISTANCING DUE TO THE COVID-19 PANDEMIC.

## 2022-02-02 ENCOUNTER — Ambulatory Visit (INDEPENDENT_AMBULATORY_CARE_PROVIDER_SITE_OTHER): Payer: 59 | Admitting: Behavioral Health

## 2022-02-02 DIAGNOSIS — F4323 Adjustment disorder with mixed anxiety and depressed mood: Secondary | ICD-10-CM

## 2022-02-02 LAB — ABO AND RH: Rh Factor: POSITIVE

## 2022-02-02 NOTE — Progress Notes (Signed)
[place] Counselor/Therapist Progress Note  Patient ID: Crystal Melton, MRN: [id number],    Date: 02/02/2022  Time Spent: 79 min In Person @ [facility] Office   Treatment Type: Individual Therapy  Reported Symptoms: Pt exp'g reduction in stress levels due to taking some vacation days this week: T/W/F & [place] [employer] directed Pt to take time for herself & "have a life". Pt is making the most of her time sleeping, cooking & caring for her GodSon.  Pt is considering a career change & wants to finish her Sch plans. She is waiting to resign bc she needs a cc of her Contract w/all appropriate signatures.   Mental Status Exam: Appearance:  Neat     Behavior: Appropriate and Sharing  Motor: Normal  Speech/Language:  Clear and Coherent and Normal Rate  Affect: Appropriate  Mood: normal  Thought process: normal  Thought content:   WNL  Sensory/Perceptual disturbances:   WNL  Orientation: oriented to person, place, and time/date  Attention: Good  Concentration: Good  Memory: WNL  Fund of knowledge:  Good  Insight:   Good  Judgment:  Good  Impulse Control: Good   Risk Assessment: Danger to Self:  No Self-injurious Behavior: No Danger to Others: No Duty to Warn:no Physical Aggression / Violence:No  Access to Firearms a concern: No  Gang Involvement:No   Subjective: Pt initiated Wellbutrin SR 150mg  on Tue w/her PCP. She is happy she decided to do this & wants the extra support. Her Family agrees she is taking a healthy step.    Interventions: Solution-Oriented/Positive Psychology and Psycho-education/Bibliotherapy re: medication & s/e profile.  Diagnosis:Adjustment disorder with mixed anxiety and depressed mood  Plan: [place] sts she has been relieved this week to be able to catch up on her sleep & have the personal time to consider her goals for owning a [employer] [place] a [place] where she can live close & have agreements for [employer] to [employer]. Pt is devoted to  her work & has plans beyond [place] work. Pt will observe & note where her time takes her & discuss in next session.  Target Date: 03/20/2022  Progress: 0  Frequency: Twice monthly  Modality: Indiv Pt will observe for any s/e with the Wellbutrin & discuss @ next session.  Target Date: 03/20/2022 Progress:0 Frequency: Twice monthly Modality: Boykin Reaper, LMFT

## 2022-02-02 NOTE — Progress Notes (Signed)
                Joeph Szatkowski L Yohann Curl, LMFT 

## 2022-02-17 ENCOUNTER — Ambulatory Visit (INDEPENDENT_AMBULATORY_CARE_PROVIDER_SITE_OTHER): Payer: 59 | Admitting: Behavioral Health

## 2022-02-17 DIAGNOSIS — F4323 Adjustment disorder with mixed anxiety and depressed mood: Secondary | ICD-10-CM | POA: Diagnosis not present

## 2022-02-17 NOTE — Progress Notes (Signed)
                Kendry Pfarr L Jonessa Triplett, LMFT 

## 2022-02-17 NOTE — Progress Notes (Signed)
[facility] Counselor/Therapist Progress Note  Patient ID: Crystal Melton, MRN: [id number],    Date: 02/17/2022  Time Spent: 72 min Caregility video; Pt in private Conf Rm @ work & Provider remote from [employer]   Treatment Type: Individual Therapy  Reported Symptoms: Pt anx/dep is reducted due to the resignation of her GM.  Mental Status Exam: Appearance:  Neat     Behavior: Appropriate and Sharing  Motor: Normal  Speech/Language:  Clear and Coherent and Normal Rate  Affect: Appropriate  Mood: normal  Thought process: normal  Thought content:   WNL  Sensory/Perceptual disturbances:   WNL  Orientation: oriented to person, place, and time/date  Attention: Good  Concentration: Good  Memory: WNL  Fund of knowledge:  Good  Insight:   Good  Judgment:  Good  Impulse Control: Good   Risk Assessment: Danger to Self:  No Self-injurious Behavior: No Danger to Others: No Duty to Warn:no Physical Aggression / Violence:No  Access to Firearms a concern: No  Gang Involvement:No   Subjective: Pt has been informed the GM of the [employer] has resigned. This has been a benefit to Pt & her workload due to the awareness she is in charge until a new GM is hired.    Interventions: Solution-Oriented/Positive Psychology  Diagnosis:Adjustment disorder with mixed anxiety and depressed mood  Plan: Crystal Melton explained her GM resigned & she & the Staff are happy & more relaxed now. The turn of events has even impacted the Guests. She is trying to enroll in [facility] soon after Admit Testing. She has plans for Sch & wants to be able to follow through on this in 2023. Pt is adjusting to the new situation & will attend session in one month for any needed support.  Target Date: 03/20/2022  Progress: 7  Frequency: Monthly  Modality: Boykin Reaper, LMFT

## 2022-02-23 ENCOUNTER — Other Ambulatory Visit: Payer: Self-pay | Admitting: Nurse Practitioner

## 2022-02-23 DIAGNOSIS — F329 Major depressive disorder, single episode, unspecified: Secondary | ICD-10-CM

## 2022-03-01 ENCOUNTER — Ambulatory Visit (INDEPENDENT_AMBULATORY_CARE_PROVIDER_SITE_OTHER): Payer: 59 | Admitting: Nurse Practitioner

## 2022-03-01 ENCOUNTER — Encounter: Payer: Self-pay | Admitting: Nurse Practitioner

## 2022-03-01 VITALS — BP 132/66 | HR 79 | Temp 98.4°F | Ht 61.0 in | Wt 295.8 lb

## 2022-03-01 DIAGNOSIS — F329 Major depressive disorder, single episode, unspecified: Secondary | ICD-10-CM | POA: Diagnosis not present

## 2022-03-01 DIAGNOSIS — Z6841 Body Mass Index (BMI) 40.0 and over, adult: Secondary | ICD-10-CM | POA: Diagnosis not present

## 2022-03-01 NOTE — Patient Instructions (Signed)

## 2022-03-01 NOTE — Progress Notes (Signed)
Hershal Coria Martin,acting as a Neurosurgeon for Arnette Felts, FNP.,have documented all relevant documentation on the behalf of Arnette Felts, FNP,as directed by  Arnette Felts, FNP while in the presence of Arnette Felts, FNP.    Subjective:     Patient ID: Crystal Melton , female    DOB: 01/30/1986 , 36 y.o.   MRN: [id number]   Chief Complaint  Patient presents with   Depression    HPI  Pt presents today for wellbutrin f/u. Patient states compliance with medications and has no other concerns today.   BP Readings from Last 3 Encounters: 03/01/22 : 132/66 02/01/22 : 118/74 12/02/21 : 114/72  Wt Readings from Last 3 Encounters: 03/01/22 : 295 lb 12.8 oz (134.2 kg) 02/01/22 : 293 lb 12.8 oz (133.3 kg) 12/02/21 : 299 lb (135.6 kg)  She is down to once a month with her therapist. She is better at work as well. She is getting up more and feels like she is talking back to her more normal self.     Depression        This is a chronic problem.  The onset quality is gradual.   Associated symptoms include hopelessness.  Associated symptoms include no decreased concentration and no fatigue.  Past treatments include SSRIs - Selective serotonin reuptake inhibitors.  Risk factors include family history.     Past Medical History:  Diagnosis Date   Pseudotumor      Family History  Problem Relation Age of Onset   Diabetes Mother    Hypertension Mother    Diabetes Father    Hypertension Father    COPD Father    Heart disease Brother    Diabetes Maternal Grandmother    Glaucoma Paternal Grandfather      Current Outpatient Medications:    acetaminophen (TYLENOL) 325 MG tablet, Take 2 tablets (650 mg total) by mouth every 6 (six) hours as needed for moderate pain or headache., Disp: , Rfl:    acetaZOLAMIDE ER (DIAMOX) 500 MG capsule, Take 1 capsule (500 mg total) by mouth 2 (two) times daily. Please call and make overdue appt for further refills, 1st attempt, Disp: 180 capsule, Rfl: 3    buPROPion (WELLBUTRIN XL) 150 MG 24 hr tablet, TAKE 1 TABLET BY MOUTH EVERY DAY IN THE MORNING, Disp: 90 tablet, Rfl: 1   cetirizine (ZYRTEC ALLERGY) 10 MG tablet, Take 1 tablet (10 mg total) by mouth daily., Disp: 30 tablet, Rfl: 0   Vitamin D, Ergocalciferol, (DRISDOL) 1.25 MG (50000 UNIT) CAPS capsule, TAKE 1 CAPSULE BY MOUTH TWO TIMES A WEEK, Disp: 24 capsule, Rfl: 0   Allergies  Allergen Reactions   Penicillins Swelling     Review of Systems  Constitutional: Negative.  Negative for fatigue.  Respiratory: Negative.    Cardiovascular: Negative.   Neurological: Negative.   Psychiatric/Behavioral: Negative.  Positive for depression. Negative for decreased concentration.      Today's Vitals   03/01/22 1419  BP: 132/66  Pulse: 79  Temp: 98.4 F (36.9 C)  TempSrc: Oral  Weight: 295 lb 12.8 oz (134.2 kg)  Height: 5\' 1"  (1.549 m)  PainSc: 0-No pain   Body mass index is 55.89 kg/m.  Wt Readings from Last 3 Encounters:  03/01/22 295 lb 12.8 oz (134.2 kg)  02/01/22 293 lb 12.8 oz (133.3 kg)  12/02/21 299 lb (135.6 kg)    Objective:  Physical Exam Vitals reviewed.  Constitutional:      Appearance: Normal appearance.  Pulmonary:  Effort: Pulmonary effort is normal. No respiratory distress.     Breath sounds: No wheezing.  Skin:    Capillary Refill: Capillary refill takes less than 2 seconds.  Neurological:     General: No focal deficit present.     Mental Status: She is alert and oriented to person, place, and time.     Cranial Nerves: No cranial nerve deficit.     Motor: No weakness.         Assessment And Plan:     1. Reactive depression Comments: She is doing well and tolerating medications well and feels like her symptoms are improving. Will f/u in 3 months.   2. Class 3 severe obesity due to excess calories without serious comorbidity with body mass index (BMI) of 50.0 to 59.9 in adult [facility]) She is encouraged to strive for BMI less than 30 to decrease  cardiac risk. Advised to aim for at least 150 minutes of exercise per week.  I made her aware of blood type of O Positive.   Patient was given opportunity to ask questions. Patient verbalized understanding of the plan and was able to repeat key elements of the plan. All questions were answered to their satisfaction.  Minette Brine, FNP   I, Minette Brine, FNP, have reviewed all documentation for this visit. The documentation on 03/01/22 for the exam, diagnosis, procedures, and orders are all accurate and complete.   IF YOU HAVE BEEN REFERRED TO A SPECIALIST, IT MAY TAKE 1-2 WEEKS TO SCHEDULE/PROCESS THE REFERRAL. IF YOU HAVE NOT HEARD FROM US/SPECIALIST IN TWO WEEKS, PLEASE GIVE [place] A CALL AT [phone].   THE PATIENT IS ENCOURAGED TO PRACTICE SOCIAL DISTANCING DUE TO THE COVID-19 PANDEMIC.

## 2022-03-21 ENCOUNTER — Ambulatory Visit: Payer: 59 | Admitting: Behavioral Health

## 2022-03-21 NOTE — Progress Notes (Unsigned)
                Sayeed Weatherall L Meko Bellanger, LMFT 

## 2022-04-13 ENCOUNTER — Ambulatory Visit
Admission: EM | Admit: 2022-04-13 | Discharge: 2022-04-13 | Disposition: A | Discharge status: Home / Self Care | Payer: 59 | Attending: Urgent Care | Admitting: Urgent Care

## 2022-04-13 DIAGNOSIS — H65192 Other acute nonsuppurative otitis media, left ear: Secondary | ICD-10-CM | POA: Diagnosis not present

## 2022-04-13 DIAGNOSIS — H6122 Impacted cerumen, left ear: Secondary | ICD-10-CM | POA: Diagnosis not present

## 2022-04-13 DIAGNOSIS — H9202 Otalgia, left ear: Secondary | ICD-10-CM

## 2022-04-13 LAB — POCT RAPID STREP A (OFFICE): Rapid Strep A Screen: NEGATIVE

## 2022-04-13 MED ORDER — AZITHROMYCIN 250 MG PO TABS
ORAL_TABLET | ORAL | 0 refills | Status: DC
Start: 2022-04-13 — End: 2022-05-25

## 2022-04-13 NOTE — ED Provider Notes (Signed)
[facility]  Note:  This document was prepared using Conservation officer, historic buildings and may include unintentional dictation errors.  MRN: [id number] DOB: 1985-05-24  Subjective:   Crystal Melton is a 36 y.o. female presenting for 1 week history of persistent left ear pain that is radiating internally toward her throat on the left side.  Has also started to have some gum pain.  Has a history of cerumen impaction and has typically needed her ears cleaned out.  No ear drainage, dizziness, tinnitus, painful swallowing.  No fever.  No current facility-administered medications for this encounter.  Current Outpatient Medications:    acetaZOLAMIDE ER (DIAMOX) 500 MG capsule, Take 1 capsule (500 mg total) by mouth 2 (two) times daily. Please call and make overdue appt for further refills, 1st attempt, Disp: 180 capsule, Rfl: 3   buPROPion (WELLBUTRIN XL) 150 MG 24 hr tablet, TAKE 1 TABLET BY MOUTH EVERY DAY IN THE MORNING, Disp: 90 tablet, Rfl: 1   cetirizine (ZYRTEC ALLERGY) 10 MG tablet, Take 1 tablet (10 mg total) by mouth daily., Disp: 30 tablet, Rfl: 0   Vitamin D, Ergocalciferol, (DRISDOL) 1.25 MG (50000 UNIT) CAPS capsule, TAKE 1 CAPSULE BY MOUTH TWO TIMES A WEEK, Disp: 24 capsule, Rfl: 0   acetaminophen (TYLENOL) 325 MG tablet, Take 2 tablets (650 mg total) by mouth every 6 (six) hours as needed for moderate pain or headache., Disp: , Rfl:    Allergies  Allergen Reactions   Penicillins Swelling    Past Medical History:  Diagnosis Date   Pseudotumor      Past Surgical History:  Procedure Laterality Date   CHOLECYSTECTOMY      Family History  Problem Relation Age of Onset   Diabetes Mother    Hypertension Mother    Diabetes Father    Hypertension Father    COPD Father    Heart disease Brother    Diabetes Maternal Grandmother    Glaucoma Paternal Grandfather     Social History   Tobacco Use   Smoking status: Never   Smokeless tobacco: Never   Vaping Use   Vaping Use: Never used  Substance Use Topics   Alcohol use: Yes    Comment: occ   Drug use: Never    ROS   Objective:   Vitals: BP 108/74 (BP Location: Left Arm)   Pulse 87   Temp 99.3 F (37.4 C) (Oral)   Resp 18   Ht 5\' 1"  (1.549 m)   Wt 290 lb (131.5 kg)   LMP 03/26/2022   SpO2 97%   BMI 54.80 kg/m   Physical Exam Constitutional:      General: She is not in acute distress.    Appearance: Normal appearance. She is well-developed. She is not ill-appearing, toxic-appearing or diaphoretic.  HENT:     Head: Normocephalic and atraumatic.     Right Ear: Tympanic membrane, ear canal and external ear normal. No drainage, swelling or tenderness. No middle ear effusion. There is no impacted cerumen. Tympanic membrane is not injected, perforated, erythematous or bulging.     Left Ear: Ear canal and external ear normal. No drainage, swelling or tenderness.  No middle ear effusion. There is no impacted cerumen. Tympanic membrane is erythematous. Tympanic membrane is not injected, perforated or bulging.     Nose: Nose normal.     Mouth/Throat:     Mouth: Mucous membranes are moist.     Pharynx: No pharyngeal swelling, oropharyngeal exudate, posterior oropharyngeal  erythema or uvula swelling.     Tonsils: No tonsillar exudate or tonsillar abscesses. 0 on the right. 0 on the left.  Eyes:     General: No scleral icterus.       Right eye: No discharge.        Left eye: No discharge.     Extraocular Movements: Extraocular movements intact.  Cardiovascular:     Rate and Rhythm: Normal rate.  Pulmonary:     Effort: Pulmonary effort is normal.  Skin:    General: Skin is warm and dry.  Neurological:     General: No focal deficit present.     Mental Status: She is alert and oriented to person, place, and time.  Psychiatric:        Mood and Affect: Mood normal.        Behavior: Behavior normal.     Results for orders placed or performed during the hospital encounter  of 04/13/22 (from the past 24 hour(s))  POCT rapid strep A     Status: None   Collection Time: 04/13/22  2:58 PM  Result Value Ref Range   Rapid Strep A Screen Negative Negative   Ear lavage performed using mixture of peroxide and water.  Pressure irrigation performed using a bottle and a thin ear tube.  Left ear lavage.  No curette was used.   Assessment and Plan :   PDMP not reviewed this encounter.  1. Other acute nonsuppurative otitis media of left ear, recurrence not specified   2. Left ear pain   3. Impacted cerumen of left ear     Successful left ear lavage.  General management of cerumen impaction reviewed with patient.  Anticipatory guidance provided.  Patient cannot tolerate beta-lactam class at all.  Recommended azithromycin for an otitis media.  Use supportive care otherwise.  Counseled patient on potential for adverse effects with medications prescribed/recommended today, ER and return-to-clinic precautions discussed, patient verbalized understanding.    Jaynee Eagles, PA-C 04/13/22 1520

## 2022-04-13 NOTE — ED Triage Notes (Signed)
X1 week  Pt states that she has some dental pain that has radiated to her left ear down to her throat.

## 2022-04-25 ENCOUNTER — Other Ambulatory Visit: Payer: Self-pay | Admitting: Nurse Practitioner

## 2022-04-25 DIAGNOSIS — E559 Vitamin D deficiency, unspecified: Secondary | ICD-10-CM

## 2022-05-25 ENCOUNTER — Ambulatory Visit
Admission: EM | Admit: 2022-05-25 | Discharge: 2022-05-25 | Disposition: A | Discharge status: Home / Self Care | Payer: 59 | Attending: Urgent Care | Admitting: Urgent Care

## 2022-05-25 DIAGNOSIS — B349 Viral infection, unspecified: Secondary | ICD-10-CM | POA: Insufficient documentation

## 2022-05-25 DIAGNOSIS — R509 Fever, unspecified: Secondary | ICD-10-CM | POA: Insufficient documentation

## 2022-05-25 DIAGNOSIS — U071 COVID-19: Secondary | ICD-10-CM | POA: Diagnosis not present

## 2022-05-25 MED ORDER — BENZONATATE 100 MG PO CAPS: 100.0000 mg | ORAL_CAPSULE | Freq: Three times a day (TID) | ORAL | 0 refills | Status: DC | PRN

## 2022-05-25 MED ORDER — PAXLOVID (300/100) 20 X 150 MG & 10 X 100MG PO TBPK: ORAL_TABLET | ORAL | 0 refills | Status: DC

## 2022-05-25 MED ORDER — PROMETHAZINE-DM 6.25-15 MG/5ML PO SYRP: 5.0000 mL | ORAL_SOLUTION | Freq: Three times a day (TID) | ORAL | 0 refills | Status: DC | PRN

## 2022-05-25 MED ORDER — PSEUDOEPHEDRINE HCL 60 MG PO TABS: 60.0000 mg | ORAL_TABLET | Freq: Three times a day (TID) | ORAL | 0 refills | Status: DC | PRN

## 2022-05-25 NOTE — Discharge Instructions (Signed)

## 2022-05-25 NOTE — ED Provider Notes (Signed)
[facility]  Note:  This document was prepared using Systems analyst and may include unintentional dictation errors.  MRN: [id number] DOB: [birth year]  Subjective:   Crystal Melton is a 37 y.o. female presenting for acute onset today of fever, scratchy throat, malaise. Patient traveled to and from [place].  Tested positive for COVID-19.  No chest pain, shortness of breath or wheezing.  Has mild cough.  Takes acetazolamide, Wellbutrin, vitamin D.   Allergies  Allergen Reactions   Penicillins Swelling    Past Medical History:  Diagnosis Date   Pseudotumor      Past Surgical History:  Procedure Laterality Date   CHOLECYSTECTOMY      Family History  Problem Relation Age of Onset   Diabetes Mother    Hypertension Mother    Diabetes Father    Hypertension Father    COPD Father    Heart disease Brother    Diabetes Maternal Grandmother    Glaucoma Paternal Grandfather     Social History   Tobacco Use   Smoking status: Never   Smokeless tobacco: Never  Vaping Use   Vaping Use: Never used  Substance Use Topics   Alcohol use: Yes    Comment: occ   Drug use: Never    ROS   Objective:   Vitals: BP 100/73 (BP Location: Right Arm)   Pulse 92   Temp 99.6 F (37.6 C) (Oral)   Resp 18   SpO2 99%   Physical Exam Constitutional:      General: She is not in acute distress.    Appearance: Normal appearance. She is well-developed and normal weight. She is not ill-appearing, toxic-appearing or diaphoretic.  HENT:     Head: Normocephalic and atraumatic.     Right Ear: Tympanic membrane, ear canal and external ear normal. No drainage or tenderness. No middle ear effusion. There is no impacted cerumen. Tympanic membrane is not erythematous or bulging.     Left Ear: Tympanic membrane, ear canal and external ear normal. No drainage or tenderness.  No middle ear effusion. There is no impacted cerumen. Tympanic membrane is not  erythematous or bulging.     Nose: Nose normal. No congestion or rhinorrhea.     Mouth/Throat:     Mouth: Mucous membranes are moist. No oral lesions.     Pharynx: No pharyngeal swelling, oropharyngeal exudate, posterior oropharyngeal erythema or uvula swelling.     Tonsils: No tonsillar exudate or tonsillar abscesses.  Eyes:     General: No scleral icterus.       Right eye: No discharge.        Left eye: No discharge.     Extraocular Movements: Extraocular movements intact.     Right eye: Normal extraocular motion.     Left eye: Normal extraocular motion.     Conjunctiva/sclera: Conjunctivae normal.  Cardiovascular:     Rate and Rhythm: Normal rate and regular rhythm.     Heart sounds: Normal heart sounds. No murmur heard.    No friction rub. No gallop.  Pulmonary:     Effort: Pulmonary effort is normal. No respiratory distress.     Breath sounds: No stridor. No wheezing, rhonchi or rales.  Chest:     Chest wall: No tenderness.  Musculoskeletal:     Cervical back: Normal range of motion and neck supple.  Lymphadenopathy:     Cervical: No cervical adenopathy.  Skin:    General: Skin is warm and dry.  Neurological:     General: No focal deficit present.     Mental Status: She is alert and oriented to person, place, and time.  Psychiatric:        Mood and Affect: Mood normal.        Behavior: Behavior normal.     Assessment and Plan :   PDMP not reviewed this encounter.  1. Clinical diagnosis of COVID-19   2. Acute viral syndrome   3. Fever, unspecified     Recommended Paxlovid treatment.  Confirmation testing pending. Deferred imaging given clear cardiopulmonary exam, hemodynamically stable vital signs. Will manage COVID-19 with supportive care.  Offered symptomatic relief. Counseled patient on potential for adverse effects with medications prescribed/recommended today, strict ER and return-to-clinic precautions discussed, patient verbalized understanding.     Jaynee Eagles, [place] 05/26/22 [phone]

## 2022-05-25 NOTE — ED Triage Notes (Signed)
Pt c/o fever, runny nose, sore throat day 1- reports +covid home test today-NAD-steady gait

## 2022-05-26 LAB — SARS CORONAVIRUS 2 (TAT 6-24 HRS): SARS Coronavirus 2: POSITIVE — AB

## 2022-06-01 ENCOUNTER — Encounter: Payer: Self-pay | Admitting: Nurse Practitioner

## 2022-06-01 ENCOUNTER — Ambulatory Visit (INDEPENDENT_AMBULATORY_CARE_PROVIDER_SITE_OTHER): Payer: 59 | Admitting: Nurse Practitioner

## 2022-06-01 VITALS — BP 130/70 | HR 100 | Temp 98.3°F | Ht 61.0 in | Wt 300.0 lb

## 2022-06-01 DIAGNOSIS — Z8616 Personal history of COVID-19: Secondary | ICD-10-CM | POA: Diagnosis not present

## 2022-06-01 DIAGNOSIS — F329 Major depressive disorder, single episode, unspecified: Secondary | ICD-10-CM

## 2022-06-01 DIAGNOSIS — E559 Vitamin D deficiency, unspecified: Secondary | ICD-10-CM

## 2022-06-01 DIAGNOSIS — L659 Nonscarring hair loss, unspecified: Secondary | ICD-10-CM | POA: Diagnosis not present

## 2022-06-01 NOTE — Progress Notes (Signed)
I,Tianna Badgett,acting as a Education administrator for [employer], FNP.,have documented all relevant documentation on the behalf of Minette Brine, FNP,as directed by  Minette Brine, FNP while in the presence of Minette Brine, [place].  Subjective:     Patient ID: Crystal Melton , female    DOB: 01/03/1986 , 37 y.o.   MRN: [id number]   Chief Complaint  Patient presents with   Depression    HPI  Here to follow up with her depression. She had covid on 05/25/2022 - the first day was bad then after she had a cold. She had a positive at home but had a PCR. She is doing well now.   She has been taking wellbutrin and she reports having hair loss. She notices more in November when her hair was coming out in "clumps".   Wt Readings from Last 3 Encounters: 06/01/22 : 300 lb (136.1 kg) 04/13/22 : 290 lb (131.5 kg) 03/01/22 : 295 lb 12.8 oz (134.2 kg)    Depression        This is a chronic problem.  Associated symptoms include no fatigue.  Past treatments include SSRIs - Selective serotonin reuptake inhibitors.    Past Medical History:  Diagnosis Date   Pseudotumor      Family History  Problem Relation Age of Onset   Diabetes Mother    Hypertension Mother    Diabetes Father    Hypertension Father    COPD Father    Heart disease Brother    Diabetes Maternal Grandmother    Glaucoma Paternal Grandfather      Current Outpatient Medications:    acetaminophen (TYLENOL) 325 MG tablet, Take 2 tablets (650 mg total) by mouth every 6 (six) hours as needed for moderate pain or headache., Disp: , Rfl:    acetaZOLAMIDE ER (DIAMOX) 500 MG capsule, Take 1 capsule (500 mg total) by mouth 2 (two) times daily. Please call and make overdue appt for further refills, 1st attempt, Disp: 180 capsule, Rfl: 3   benzonatate (TESSALON) 100 MG capsule, Take 1 capsule (100 mg total) by mouth 3 (three) times daily as needed for cough., Disp: 30 capsule, Rfl: 0   buPROPion (WELLBUTRIN XL) 150 MG 24 hr tablet, TAKE 1 TABLET BY  MOUTH EVERY DAY IN THE MORNING, Disp: 90 tablet, Rfl: 1   promethazine-dextromethorphan (PROMETHAZINE-DM) 6.25-15 MG/5ML syrup, Take 5 mLs by mouth 3 (three) times daily as needed for cough., Disp: 200 mL, Rfl: 0   pseudoephedrine (SUDAFED) 60 MG tablet, Take 1 tablet (60 mg total) by mouth every 8 (eight) hours as needed for congestion., Disp: 30 tablet, Rfl: 0   Vitamin D, Ergocalciferol, (DRISDOL) 1.25 MG (50000 UNIT) CAPS capsule, Take as directed., Disp: 24 capsule, Rfl: 0   Allergies  Allergen Reactions   Penicillins Swelling     Review of Systems  Constitutional:  Negative for fatigue.  Respiratory: Negative.    Cardiovascular: Negative.   Skin:        Hair loss   Psychiatric/Behavioral:  Positive for depression.      Today's Vitals   06/01/22 1541  BP: 130/70  Pulse: 100  Temp: 98.3 F (36.8 C)  TempSrc: Oral  Weight: 300 lb (136.1 kg)  Height: 5' 1"$  (1.549 m)   Body mass index is 56.68 kg/m.   Objective:  Physical Exam Vitals reviewed.  Constitutional:      General: She is not in acute distress.    Appearance: Normal appearance.  Cardiovascular:     Rate and  Rhythm: Normal rate and regular rhythm.     Pulses: Normal pulses.     Heart sounds: Normal heart sounds. No murmur heard. Pulmonary:     Effort: Pulmonary effort is normal. No respiratory distress.     Breath sounds: Normal breath sounds. No wheezing.  Skin:    General: Skin is warm and dry.     Comments: She does have breakage and thinning.   Neurological:     General: No focal deficit present.     Mental Status: She is alert and oriented to person, place, and time.     Cranial Nerves: No cranial nerve deficit.     Motor: No weakness.         Assessment And Plan:     1. Reactive depression Comments: She is doing well with her current regimen - Thyroid Panel With TSH  2. Vitamin D deficiency Comments: Will check vitamin d levels. Encouraged to make sure taking a supplement and to get at  least 15 minutes of sunlight daily - Thyroid Panel With TSH  3. Hair loss Comments: Her hair is broken off in several areas, will check thyroid levels pending results will consider referral to Dermatology - Vitamin B12 - VITAMIN D 25 Hydroxy (Vit-D Deficiency, Fractures) - Thyroid Panel With TSH  4. History of COVID-19 Comments: Tested positive on 05/25/2022, she is doing better now     Patient was given opportunity to ask questions. Patient verbalized understanding of the plan and was able to repeat key elements of the plan. All questions were answered to their satisfaction.  Minette Brine, FNP   I, Minette Brine, FNP, have reviewed all documentation for this visit. The documentation on 06/01/22 for the exam, diagnosis, procedures, and orders are all accurate and complete.   IF YOU HAVE BEEN REFERRED TO A SPECIALIST, IT MAY TAKE 1-2 WEEKS TO SCHEDULE/PROCESS THE REFERRAL. IF YOU HAVE NOT HEARD FROM US/SPECIALIST IN TWO WEEKS, PLEASE GIVE [place] A CALL AT [phone].   THE PATIENT IS ENCOURAGED TO PRACTICE SOCIAL DISTANCING DUE TO THE COVID-19 PANDEMIC.

## 2022-06-01 NOTE — Patient Instructions (Addendum)
Managing Depression, Adult Depression is a mental health condition that affects your thoughts, feelings, and actions. Being diagnosed with depression can bring you relief if you did not know why you have felt or behaved a certain way. It could also leave you feeling overwhelmed. Finding ways to manage your symptoms can help you feel more positive about your future. How to manage lifestyle changes Being depressed is difficult. Depression can increase the level of everyday stress. Stress can make depression symptoms worse. You may believe your symptoms cannot be managed or will never improve. However, there are many things you can try to help manage your symptoms. There is hope. Managing stress  Stress is your body's reaction to life changes and events, both good and bad. Stress can add to your feelings of depression. Learning to manage your stress can help lessen your feelings of depression. Try some of the following approaches to reducing your stress (stress reduction techniques): Listen to music that you enjoy and that inspires you. Try using a meditation app or take a meditation class. Develop a practice that helps you connect with your spiritual self. Walk in nature, pray, or go to a place of worship. Practice deep breathing. To do this, inhale slowly through your nose. Pause at the top of your inhale for a few seconds and then exhale slowly, letting yourself relax. Repeat this three or four times. Practice yoga to help relax and work your muscles. Choose a stress reduction technique that works for you. These techniques take time and practice to develop. Set aside 5-15 minutes a day to do them. Therapists can offer training in these techniques. Do these things to help manage stress: Keep a journal. Know your limits. Set healthy boundaries for yourself and others, such as saying "no" when you think something is too much. Pay attention to how you react to certain situations. You may not be able to  control everything, but you can change your reaction. Add humor to your life by watching funny movies or shows. Make time for activities that you enjoy and that relax you. Spend less time using electronics, especially at night before bed. The light from screens can make your brain think it is time to get up rather than go to bed.  Medicines Medicines, such as antidepressants, are often a part of treatment for depression. Talk with your pharmacist or health care provider about all the medicines, supplements, and herbal products that you take, their possible side effects, and what medicines and other products are safe to take together. Make sure to report any side effects you may have to your health care provider. Relationships Your health care provider may suggest family therapy, couples therapy, or individual therapy as part of your treatment. How to recognize changes Everyone responds differently to treatment for depression. As you recover from depression, you may start to: Have more interest in doing activities. Feel more hopeful. Have more energy. Eat a more regular amount of food. Have better mental focus. It is important to recognize if your depression is not getting better or is getting worse. The symptoms you had in the beginning may return, such as: Feeling tired. Eating too much or too little. Sleeping too much or too little. Feeling restless, agitated, or hopeless. Trouble focusing or making decisions. Having unexplained aches and pains. Feeling irritable, angry, or aggressive. If you or your family members notice these symptoms coming back, let your health care provider know right away. Follow these instructions at home: Activity Try to   get some form of exercise each day, such as walking. Try yoga, mindfulness, or other stress reduction techniques. Participate in group activities if you are able. Lifestyle Get enough sleep. Cut down on or stop using caffeine, tobacco,  alcohol, and any other harmful substances. Eat a healthy diet that includes plenty of vegetables, fruits, whole grains, low-fat dairy products, and lean protein. Limit foods that are high in solid fats, added sugar, or salt (sodium). General instructions Take over-the-counter and prescription medicines only as told by your health care provider. Keep all follow-up visits. It is important for your health care provider to check on your mood, behavior, and medicines. Your health care provider may need to make changes to your treatment. Where to find support Talking to others  Friends and family members can be sources of support and guidance. Talk to trusted friends or family members about your condition. Explain your symptoms and let them know that you are working with a health care provider to treat your depression. Tell friends and family how they can help. Finances Find mental health providers that fit with your financial situation. Talk with your health care provider if you are worried about access to food, housing, or medicine. Call your insurance company to learn about your co-pays and prescription plan. Where to find more information You can find support in your area from: Anxiety and Depression Association of America (ADAA): adaa.org Mental Health America: mentalhealthamerica.net [employer] on Mental Illness: nami.org Contact a health care provider if: You stop taking your antidepressant medicines, and you have any of these symptoms: Nausea. Headache. Light-headedness. Chills and body aches. Not being able to sleep (insomnia). You or your friends and family think your depression is getting worse. Get help right away if: You have thoughts of hurting yourself or others. Get help right away if you feel like you may hurt yourself or others, or have thoughts about taking your own life. Go to your nearest emergency room or: Call 911. Call the [facility] at  [phone] or 988. This is open 24 hours a day. Text the Crisis Text Line at 864-763-2136. This information is not intended to replace advice given to you by your health care provider. Make sure you discuss any questions you have with your health care provider. Document Revised: 08/24/2021 Document Reviewed: 08/24/2021 Elsevier Patient Education  [facility] Address: [place] [address], [place], [place] [address] Phone: [phone]

## 2022-06-02 LAB — VITAMIN D 25 HYDROXY (VIT D DEFICIENCY, FRACTURES): Vit D, 25-Hydroxy: 59.4 ng/mL (ref 30.0–100.0)

## 2022-06-02 LAB — VITAMIN B12: Vitamin B-12: 358 pg/mL (ref 232–1245)

## 2022-06-02 LAB — THYROID PANEL WITH TSH
Free Thyroxine Index: 1.8 (ref 1.2–4.9)
T3 Uptake Ratio: 25 % (ref 24–39)
T4, Total: 7 ug/dL (ref 4.5–12.0)
TSH: 1.49 u[IU]/mL (ref 0.450–4.500)

## 2022-06-09 ENCOUNTER — Other Ambulatory Visit: Payer: Self-pay | Admitting: Nurse Practitioner

## 2022-06-09 DIAGNOSIS — E559 Vitamin D deficiency, unspecified: Secondary | ICD-10-CM

## 2022-06-09 MED ORDER — VITAMIN D (ERGOCALCIFEROL) 1.25 MG (50000 UNIT) PO CAPS: ORAL_CAPSULE | ORAL | 0 refills | Status: DC

## 2022-08-15 ENCOUNTER — Other Ambulatory Visit: Payer: Self-pay | Admitting: Nurse Practitioner

## 2022-08-15 DIAGNOSIS — E559 Vitamin D deficiency, unspecified: Secondary | ICD-10-CM

## 2022-08-17 ENCOUNTER — Other Ambulatory Visit: Payer: Self-pay | Admitting: Nurse Practitioner

## 2022-08-17 DIAGNOSIS — F329 Major depressive disorder, single episode, unspecified: Secondary | ICD-10-CM

## 2022-08-17 MED ORDER — BUPROPION HCL ER (XL) 150 MG PO TB24: ORAL_TABLET | ORAL | 1 refills | Status: DC

## 2022-08-31 ENCOUNTER — Other Ambulatory Visit: Payer: Self-pay | Admitting: Nurse Practitioner

## 2022-08-31 DIAGNOSIS — E559 Vitamin D deficiency, unspecified: Secondary | ICD-10-CM

## 2022-11-16 ENCOUNTER — Ambulatory Visit (INDEPENDENT_AMBULATORY_CARE_PROVIDER_SITE_OTHER): Payer: 59 | Admitting: Nurse Practitioner

## 2022-11-16 ENCOUNTER — Encounter: Payer: Self-pay | Admitting: Nurse Practitioner

## 2022-11-16 VITALS — BP 110/70 | HR 81 | Temp 98.5°F | Ht 61.0 in | Wt 329.4 lb

## 2022-11-16 DIAGNOSIS — Z0000 Encounter for general adult medical examination without abnormal findings: Secondary | ICD-10-CM

## 2022-11-16 DIAGNOSIS — Z79899 Other long term (current) drug therapy: Secondary | ICD-10-CM

## 2022-11-16 DIAGNOSIS — E559 Vitamin D deficiency, unspecified: Secondary | ICD-10-CM

## 2022-11-16 DIAGNOSIS — M545 Low back pain, unspecified: Secondary | ICD-10-CM

## 2022-11-16 DIAGNOSIS — I10 Essential (primary) hypertension: Secondary | ICD-10-CM | POA: Diagnosis not present

## 2022-11-16 DIAGNOSIS — R6889 Other general symptoms and signs: Secondary | ICD-10-CM

## 2022-11-16 DIAGNOSIS — R319 Hematuria, unspecified: Secondary | ICD-10-CM

## 2022-11-16 DIAGNOSIS — Z6841 Body Mass Index (BMI) 40.0 and over, adult: Secondary | ICD-10-CM

## 2022-11-16 DIAGNOSIS — Z1159 Encounter for screening for other viral diseases: Secondary | ICD-10-CM

## 2022-11-16 DIAGNOSIS — Z87828 Personal history of other (healed) physical injury and trauma: Secondary | ICD-10-CM

## 2022-11-16 DIAGNOSIS — G8929 Other chronic pain: Secondary | ICD-10-CM

## 2022-11-16 DIAGNOSIS — Z114 Encounter for screening for human immunodeficiency virus [HIV]: Secondary | ICD-10-CM

## 2022-11-16 DIAGNOSIS — Z113 Encounter for screening for infections with a predominantly sexual mode of transmission: Secondary | ICD-10-CM

## 2022-11-16 DIAGNOSIS — F329 Major depressive disorder, single episode, unspecified: Secondary | ICD-10-CM | POA: Diagnosis not present

## 2022-11-16 LAB — POCT URINALYSIS DIP (CLINITEK)
Bilirubin, UA: NEGATIVE
Glucose, UA: NEGATIVE mg/dL
Ketones, POC UA: NEGATIVE mg/dL
Nitrite, UA: NEGATIVE
POC PROTEIN,UA: NEGATIVE
Spec Grav, UA: >=1.03 — AB (ref 1.010–1.025)
Urobilinogen, UA: 0.2 E.U./dL
pH, UA: 5.5 (ref 5.0–8.0)

## 2022-11-16 MED ORDER — BUPROPION HCL ER (XL) 300 MG PO TB24
300.0000 mg | ORAL_TABLET | ORAL | 1 refills | Status: AC
Start: 2022-11-16 — End: 2023-11-16
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
## 2022-11-16 NOTE — Assessment & Plan Note (Signed)
She has approximately a 20 pound weight gain since her last visit.  Unable to exercise regularly due to chronic back pain.  Encouraged to consider water exercises/therapy.  She also has limited time due to work and school.  Encouraged to park further away when at the store or take stairs however she has back pain.  May consider referral to healthy weight and wellness or another weight management provider.
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
## 2022-11-16 NOTE — Assessment & Plan Note (Signed)
Blood pressure well-controlled.  Continue current medications.  Will check EGFR

## 2022-11-16 NOTE — Patient Instructions (Signed)
You can go to [address] for your xray of your back. Pending the results we will either refer to Orthopedics or PT

## 2022-11-16 NOTE — Assessment & Plan Note (Signed)
Has been taking vitamin B12 supplement had low normal levels, will recheck.

## 2022-11-16 NOTE — Assessment & Plan Note (Signed)
Depression screen score is up to 7, will increase Wellbutrin to 300 mg.

## 2022-11-16 NOTE — Assessment & Plan Note (Signed)
Will check vitamin D level and supplement as needed.    Also encouraged to spend 15 minutes in the sun daily.   

## 2022-11-17 LAB — CBC
Hematocrit: 41.4 % (ref 34.0–46.6)
Hemoglobin: 12.5 g/dL (ref 11.1–15.9)
MCH: 27.3 pg (ref 26.6–33.0)
MCHC: 30.2 g/dL — ABNORMAL LOW (ref 31.5–35.7)
MCV: 90 fL (ref 79–97)
Platelets: 265 10*3/uL (ref 150–450)
RBC: 4.58 x10E6/uL (ref 3.77–5.28)
RDW: 12.5 % (ref 11.7–15.4)
WBC: 7.3 10*3/uL (ref 3.4–10.8)

## 2022-11-17 LAB — VITAMIN D 25 HYDROXY (VIT D DEFICIENCY, FRACTURES): Vit D, 25-Hydroxy: 35.8 ng/mL (ref 30.0–100.0)

## 2022-11-17 LAB — CMP14+EGFR
ALT: 20 IU/L (ref 0–32)
AST: 20 IU/L (ref 0–40)
Albumin: 4.1 g/dL (ref 3.9–4.9)
Alkaline Phosphatase: 66 IU/L (ref 44–121)
BUN/Creatinine Ratio: 13 (ref 9–23)
BUN: 14 mg/dL (ref 6–20)
Bilirubin Total: 0.3 mg/dL (ref 0.0–1.2)
CO2: 17 mmol/L — ABNORMAL LOW (ref 20–29)
Calcium: 9.2 mg/dL (ref 8.7–10.2)
Chloride: 111 mmol/L — ABNORMAL HIGH (ref 96–106)
Creatinine, Ser: 1.05 mg/dL — ABNORMAL HIGH (ref 0.57–1.00)
Globulin, Total: 3.1 g/dL (ref 1.5–4.5)
Glucose: 81 mg/dL (ref 70–99)
Potassium: 4.1 mmol/L (ref 3.5–5.2)
Sodium: 141 mmol/L (ref 134–144)
Total Protein: 7.2 g/dL (ref 6.0–8.5)
eGFR: 71 mL/min/{1.73_m2} (ref >59)

## 2022-11-17 LAB — HIV ANTIBODY (ROUTINE TESTING W REFLEX): HIV Screen 4th Generation wRfx: NONREACTIVE

## 2022-11-17 LAB — VITAMIN B12: Vitamin B-12: >2000 pg/mL — ABNORMAL HIGH (ref 232–1245)

## 2022-11-17 LAB — RPR: RPR Ser Ql: NONREACTIVE

## 2022-11-17 LAB — URINE CULTURE

## 2022-11-17 LAB — MICROALBUMIN / CREATININE URINE RATIO
Creatinine, Urine: 139.4 mg/dL
Microalb/Creat Ratio: 8 mg/g creat (ref 0–29)
Microalbumin, Urine: 10.5 ug/mL

## 2022-11-17 LAB — HSV 1 AND 2 AB, IGG
HSV 1 Glycoprotein G Ab, IgG: 34.6 index — ABNORMAL HIGH (ref 0.00–0.90)
HSV 2 IgG, Type Spec: 16.8 index — ABNORMAL HIGH (ref 0.00–0.90)

## 2022-11-17 LAB — HEPATITIS B SURFACE ANTIGEN: Hepatitis B Surface Ag: NEGATIVE

## 2022-11-17 LAB — HEPATITIS C ANTIBODY: Hep C Virus Ab: NONREACTIVE

## 2022-11-19 LAB — CHLAMYDIA/GONOCOCCUS/TRICHOMONAS, NAA
Chlamydia by NAA: NEGATIVE
Gonococcus by NAA: NEGATIVE
Trich vag by NAA: NEGATIVE

## 2022-11-29 NOTE — Assessment & Plan Note (Addendum)
Behavior modifications discussed and diet history reviewed.   Pt will continue to exercise regularly and modify diet with low GI, plant based foods and decrease intake of processed foods.  Recommend intake of daily multivitamin, Vitamin D, and calcium.  Recommend self breast exams for preventive screenings, as well as recommend immunizations that include influenza, TDAP

## 2022-11-29 NOTE — Assessment & Plan Note (Signed)
Will check Hepatitis C screening due to recent recommendations to screen all adults 18 years and older

## 2022-12-13 ENCOUNTER — Telehealth: Payer: 59 | Admitting: Adult Health

## 2022-12-13 DIAGNOSIS — G932 Benign intracranial hypertension: Secondary | ICD-10-CM | POA: Diagnosis not present

## 2022-12-13 MED ORDER — ACETAZOLAMIDE ER 500 MG PO CP12: 500.0000 mg | ORAL_CAPSULE | Freq: Two times a day (BID) | ORAL | 3 refills | Status: DC

## 2022-12-13 NOTE — Progress Notes (Signed)
PATIENT: Crystal Melton DOB: 1985-10-14  REASON FOR VISIT: follow up HISTORY FROM: patient  Virtual Visit via Video Note  I connected with Crystal Melton on 12/13/22 at  1:15 PM EDT by a video enabled telemedicine application located remotely at [facility] and verified that I am speaking with the correct person using two identifiers who was located at their own home.   I discussed the limitations of evaluation and management by telemedicine and the availability of in person appointments. The patient expressed understanding and agreed to proceed.   PATIENT: Crystal Melton DOB: Jun 25, 1985  REASON FOR VISIT: follow up HISTORY FROM: patient  HISTORY OF PRESENT ILLNESS: Today 12/13/22:  Crystal Melton is a 37 y.o. female with a history of pseudotumor cerebri. Returns today for follow-up. Currently on Diamox 500 mg twice a day.  Denies headache, muffled hearing, visual changes or back pain.occasionally will have tinnitus but it goes away. Saw ophthalmologist in May and she reports that she was told everything is stable.     12/02/21: [name] is a 37 year old female with a history of pseudotumor cerebri.  She returns today for follow-up.  She reports that she has been doing well on Diamox.  Recently had visit with her ophthalmologist who cleared her to come back in 1 year.  Denies headache, muffled hearing or back pain. Reports that she has lost 25 lbs on phentermine.  She returns today for an evaluation.     HISTORY (Copied from Dr.Dohmeier's note) Crystal Melton is a 37 y.o.  African American female patient and seen here upon an ED referral on 09/23/2020 from ED/ Hospitalist.  The Mrs. Merritts was diagnosed with pseudotumor cerebri or benign intracranial hypertension she had an MRI during her brief hospitalization also asked by Dr. Toniann Fail.  She was admitted to 5 E. medical unit at the hospital her noncontrast MRI had been performed on 517, it was followed  by this contrast study which showed a 9 mm focus of enhancement in the anterior frontal lobe white matter, probably an inborn venous abnormality there was also a small lesion in the paramedian left pons.  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  Suggestion of a septal intraocular protrusion of the right optic nerve head and a partially empty sella to 6 are very suspicious for idiopathic intracranial hypertension-pseudotumor cerebri and I spinal tap had followed.  She also had a little bit of ethmoidal sinusitis.  REVIEW OF SYSTEMS: Out of a complete 14 system review of symptoms, the patient complains only of the following symptoms, and all other reviewed systems are negative.  ALLERGIES: Allergies  Allergen Reactions   Penicillins Swelling    HOME MEDICATIONS: Outpatient Medications Prior to Visit  Medication Sig Dispense Refill   acetaminophen (TYLENOL) 325 MG tablet Take 2 tablets (650 mg total) by mouth every 6 (six) hours as needed for moderate pain or headache.     acetaZOLAMIDE ER (DIAMOX) 500 MG capsule Take 1 capsule (500 mg total) by mouth 2 (two) times daily. Please call and make overdue appt for further refills, 1st attempt 180 capsule 3   buPROPion (WELLBUTRIN XL) 300 MG 24 hr tablet Take 1 tablet (300 mg total) by mouth every morning. 90 tablet 1   Vitamin D, Ergocalciferol, (DRISDOL) 1.25 MG (50000 UNIT) CAPS capsule TAKE AS DIRECTED. 24 capsule 0   No facility-administered medications prior to visit.    PAST MEDICAL HISTORY: Past Medical History:  Diagnosis Date   Allergy  Seasonal   Anxiety April 2023   Depression May 2023   Pseudotumor     PAST SURGICAL HISTORY: Past Surgical History:  Procedure Laterality Date   CHOLECYSTECTOMY      FAMILY HISTORY: Family History  Problem Relation Age of Onset   Diabetes Mother    Hypertension Mother    Diabetes Father    Hypertension Father    COPD Father    Heart disease Brother     Diabetes Maternal Grandmother    Glaucoma Paternal Grandfather     SOCIAL HISTORY: Social History   Socioeconomic History   Marital status: Single    Spouse name: Not on file   Number of children: Not on file   Years of education: Not on file   Highest education level: Not on file  Occupational History   Not on file  Tobacco Use   Smoking status: Never   Smokeless tobacco: Never  Vaping Use   Vaping status: Never Used  Substance and Sexual Activity   Alcohol use: Yes    Alcohol/week: 4.0 standard drinks of alcohol    Types: 2 Glasses of wine, 2 Standard drinks or equivalent per week    Comment: occ   Drug use: Not Currently    Types: Marijuana   Sexual activity: Yes    Birth control/protection: I.U.D.  Other Topics Concern   Not on file  Social History Narrative   Not on file   Social Determinants of Health   Financial Resource Strain: Not on file  Food Insecurity: Not on file  Transportation Needs: Not on file  Physical Activity: Not on file  Stress: Not on file  Social Connections: Not on file  Intimate Partner Violence: Not on file      PHYSICAL EXAM Generalized: Well developed, in no acute distress   Neurological examination  Mentation: Alert oriented to time, place, history taking. Follows all commands speech and language fluent Cranial nerve II-XII: Facial symmetry noted.   DIAGNOSTIC DATA (LABS, IMAGING, TESTING) - I reviewed patient records, labs, notes, testing and imaging myself where available.  Lab Results  Component Value Date   WBC 7.3 11/16/2022   HGB 12.5 11/16/2022   HCT 41.4 11/16/2022   MCV 90 11/16/2022   PLT 265 11/16/2022      Component Value Date/Time   NA 141 11/16/2022 1000   K 4.1 11/16/2022 1000   CL 111 (H) 11/16/2022 1000   CO2 17 (L) 11/16/2022 1000   GLUCOSE 81 11/16/2022 1000   GLUCOSE 85 09/16/2020 0501   BUN 14 11/16/2022 1000   CREATININE 1.05 (H) 11/16/2022 1000   CALCIUM 9.2 11/16/2022 1000   PROT 7.2  11/16/2022 1000   ALBUMIN 4.1 11/16/2022 1000   AST 20 11/16/2022 1000   ALT 20 11/16/2022 1000   ALKPHOS 66 11/16/2022 1000   BILITOT 0.3 11/16/2022 1000   GFRNONAA >60 09/16/2020 0501   Lab Results  Component Value Date   CHOL 150 10/28/2021   HDL 33 (L) 10/28/2021   LDLCALC 95 10/28/2021   TRIG 122 10/28/2021   CHOLHDL 4.5 (H) 10/28/2021   Lab Results  Component Value Date   HGBA1C 5.2 10/07/2020   Lab Results  Component Value Date   VITAMINB12 >2000 (H) 11/16/2022   Lab Results  Component Value Date   TSH 1.490 06/01/2022      ASSESSMENT AND PLAN 37 y.o. year old female  has a past medical history of Allergy, Anxiety (April 2023), Depression (May 2023), and  Pseudotumor. here with:  IIH  Continue Diamox 500 mg twice a day Keep yearly fu with ophthalmologist  FU in 1 year or sooner if needed    Butch Penny, MSN, NP-C 12/13/2022, 12:20 PM [facility] [address], [address] [place], [place] [address] [phone]

## 2022-12-19 ENCOUNTER — Other Ambulatory Visit: Payer: Self-pay | Admitting: Obstetrics and Gynecology

## 2022-12-19 ENCOUNTER — Other Ambulatory Visit (HOSPITAL_COMMUNITY)
Admission: RE | Admit: 2022-12-19 | Discharge: 2022-12-19 | Disposition: A | Discharge status: Home / Self Care | Payer: 59 | Source: Ambulatory Visit | Attending: Obstetrics and Gynecology | Admitting: Obstetrics and Gynecology

## 2022-12-19 DIAGNOSIS — Z01419 Encounter for gynecological examination (general) (routine) without abnormal findings: Secondary | ICD-10-CM | POA: Diagnosis not present

## 2022-12-20 ENCOUNTER — Other Ambulatory Visit: Payer: Self-pay | Admitting: Obstetrics and Gynecology

## 2022-12-20 DIAGNOSIS — N915 Oligomenorrhea, unspecified: Secondary | ICD-10-CM

## 2022-12-21 LAB — CYTOLOGY - PAP
Comment: NEGATIVE
Diagnosis: NEGATIVE
High risk HPV: NEGATIVE

## 2022-12-23 ENCOUNTER — Ambulatory Visit
Admission: RE | Admit: 2022-12-23 | Discharge: 2022-12-23 | Disposition: A | Discharge status: Home / Self Care | Payer: 59 | Source: Ambulatory Visit

## 2022-12-23 ENCOUNTER — Other Ambulatory Visit: Payer: Self-pay

## 2022-12-23 VITALS — BP 126/84 | HR 79 | Temp 97.8°F | Resp 18

## 2022-12-23 DIAGNOSIS — B349 Viral infection, unspecified: Secondary | ICD-10-CM | POA: Diagnosis present

## 2022-12-23 DIAGNOSIS — R0981 Nasal congestion: Secondary | ICD-10-CM | POA: Diagnosis present

## 2022-12-23 DIAGNOSIS — Z1152 Encounter for screening for COVID-19: Secondary | ICD-10-CM | POA: Diagnosis not present

## 2022-12-23 MED ORDER — BENZONATATE 200 MG PO CAPS
200.0000 mg | ORAL_CAPSULE | Freq: Three times a day (TID) | ORAL | 0 refills | Status: AC | PRN
Start: 2022-12-23 — End: ?

## 2022-12-23 MED ORDER — FLUTICASONE PROPIONATE 50 MCG/ACT NA SUSP
1.0000 | Freq: Every day | NASAL | 0 refills | Status: AC
Start: 2022-12-23 — End: ?

## 2022-12-23 NOTE — Discharge Instructions (Signed)
Clinical contact you with results of the COVID test done today if positive.  Start Tessalon as needed for cough.  Flonase daily.  Lots of rest and fluids.  Please follow-up with your PCP if your symptoms do not improve.  Please go to the emergency room for any worsening symptoms.  Hope you feel better soon!

## 2022-12-23 NOTE — ED Triage Notes (Signed)
Pt presents with c/o fever, headaches x 3 days. Pt states she has ear pain and has sinus congestion.   Reports she has felt nauseas and c/o intermittent stomach pain. States she took a covid test at home that was negative.

## 2022-12-23 NOTE — ED Provider Notes (Signed)
[facility]    CSN: [id number] Arrival date & time: 12/23/22  0920      History   Chief Complaint Chief Complaint  Patient presents with   Fever    HeadacheSinus pain - Entered by patient    HPI Crystal Melton is a 37 y.o. female  presents for evaluation of URI symptoms for 3 days. Patient reports associated symptoms of fever, headache, congestion, postnasal drip with cough, nausea, diarrhea. Denies vomiting, ear pain, sore throat, body aches, shortness of breath. Patient does not have a hx of asthma or smoking. No known sick contacts.  Pt has taken Sudafed at OTC for symptoms. Pt has no other concerns at this time.     Fever Associated symptoms: congestion, cough and headaches     Past Medical History:  Diagnosis Date   Allergy    Seasonal   Anxiety April 2023   Depression May 2023   Pseudotumor     Patient Active Problem List   Diagnosis Date Noted   Chronic bilateral low back pain without sciatica 11/16/2022   Encounter for hepatitis C screening test for low risk patient 11/16/2022   Encounter for HIV (human immunodeficiency virus) test 11/16/2022   Forgetfulness 11/16/2022   Reactive depression 11/16/2022   Vitamin D deficiency 11/16/2022   Encounter for general adult medical examination w/o abnormal findings 11/16/2022   Class 3 severe obesity due to excess calories with body mass index (BMI) of 60.0 to 69.9 in adult (HCC) 11/16/2022   Class 3 obesity (HCC) 09/17/2020   Essential hypertension 09/17/2020   Pseudotumor cerebri 09/17/2020   Blurred vision, right eye 09/16/2020   Vision loss 09/15/2020    Past Surgical History:  Procedure Laterality Date   CHOLECYSTECTOMY      OB History   No obstetric history on file.      Home Medications    Prior to Admission medications   Medication Sig Start Date End Date Taking? Authorizing Provider  benzonatate (TESSALON) 200 MG capsule Take 1 capsule (200 mg total) by mouth 3 (three) times daily  as needed. 12/23/22  Yes Radford Pax, NP  fluticasone (FLONASE) 50 MCG/ACT nasal spray Place 1 spray into both nostrils daily. 12/23/22  Yes Radford Pax, NP  acetaminophen (TYLENOL) 325 MG tablet Take 2 tablets (650 mg total) by mouth every 6 (six) hours as needed for moderate pain or headache. 09/17/20   Arrien, York Ram, MD  acetaZOLAMIDE ER (DIAMOX) 500 MG capsule Take 1 capsule (500 mg total) by mouth 2 (two) times daily. Please call and make overdue appt for further refills, 1st attempt 12/13/22   Butch Penny, NP  buPROPion (WELLBUTRIN XL) 300 MG 24 hr tablet Take 1 tablet (300 mg total) by mouth every morning. 11/16/22 11/16/23  Arnette Felts, FNP  Vitamin D, Ergocalciferol, (DRISDOL) 1.25 MG (50000 UNIT) CAPS capsule TAKE AS DIRECTED. 08/31/22   Arnette Felts, FNP    Family History Family History  Problem Relation Age of Onset   Diabetes Mother    Hypertension Mother    Diabetes Father    Hypertension Father    COPD Father    Heart disease Brother    Diabetes Maternal Grandmother    Glaucoma Paternal Grandfather     Social History Social History   Tobacco Use   Smoking status: Never   Smokeless tobacco: Never  Vaping Use   Vaping status: Never Used  Substance Use Topics   Alcohol use: Yes    Alcohol/week: 4.0  standard drinks of alcohol    Types: 2 Glasses of wine, 2 Standard drinks or equivalent per week    Comment: occ   Drug use: Not Currently    Types: Marijuana     Allergies   Penicillins   Review of Systems Review of Systems  Constitutional:  Positive for fever.  HENT:  Positive for congestion, postnasal drip and sinus pressure.   Respiratory:  Positive for cough.   Neurological:  Positive for headaches.     Physical Exam Triage Vital Signs ED Triage Vitals  Encounter Vitals Group     BP 12/23/22 0932 126/84     Systolic BP Percentile --      Diastolic BP Percentile --      Pulse Rate 12/23/22 0930 79     Resp 12/23/22 0930 18     Temp  12/23/22 0930 97.8 F (36.6 C)     Temp Source 12/23/22 0930 Oral     SpO2 12/23/22 0930 98 %     Weight --      Height --      Head Circumference --      Peak Flow --      Pain Score 12/23/22 0930 4     Pain Loc --      Pain Education --      Exclude from Growth Chart --    No data found.  Updated Vital Signs BP 126/84   Pulse 79   Temp 97.8 F (36.6 C) (Oral)   Resp 18   SpO2 98%   Visual Acuity Right Eye Distance:   Left Eye Distance:   Bilateral Distance:    Right Eye Near:   Left Eye Near:    Bilateral Near:     Physical Exam Vitals and nursing note reviewed.  Constitutional:      General: She is not in acute distress.    Appearance: She is well-developed. She is not ill-appearing.  HENT:     Head: Normocephalic and atraumatic.     Right Ear: Tympanic membrane and ear canal normal.     Left Ear: Tympanic membrane and ear canal normal.     Nose: Congestion present.     Mouth/Throat:     Mouth: Mucous membranes are moist.     Pharynx: Oropharynx is clear. Uvula midline. No oropharyngeal exudate or posterior oropharyngeal erythema.     Tonsils: No tonsillar exudate or tonsillar abscesses.  Eyes:     Conjunctiva/sclera: Conjunctivae normal.     Pupils: Pupils are equal, round, and reactive to light.  Cardiovascular:     Rate and Rhythm: Normal rate and regular rhythm.     Heart sounds: Normal heart sounds.  Pulmonary:     Effort: Pulmonary effort is normal.     Breath sounds: Normal breath sounds.  Musculoskeletal:     Cervical back: Normal range of motion and neck supple.  Lymphadenopathy:     Cervical: No cervical adenopathy.  Skin:    General: Skin is warm and dry.  Neurological:     General: No focal deficit present.     Mental Status: She is alert and oriented to person, place, and time.  Psychiatric:        Mood and Affect: Mood normal.        Behavior: Behavior normal.      UC Treatments / Results  Labs (all labs ordered are listed, but  only abnormal results are displayed) Labs Reviewed  SARS CORONAVIRUS 2 (TAT  6-24 HRS)    EKG   Radiology No results found.  Procedures Procedures (including critical care time)  Medications Ordered in UC Medications - No data to display  Initial Impression / Assessment and Plan / UC Course  I have reviewed the triage vital signs and the nursing notes.  Pertinent labs & imaging results that were available during my care of the patient were reviewed by me and considered in my medical decision making (see chart for details).     Reviewed exam and symptoms with patient.  No red flags.  COVID PCR and will contact if positive.  Tessalon and Flonase as prescribed.  PCP follow-up if symptoms do not improve.  ER precautions reviewed and patient verbalized understanding. Final Clinical Impressions(s) / UC Diagnoses   Final diagnoses:  Nasal congestion  Viral illness     Discharge Instructions      Clinical contact you with results of the COVID test done today if positive.  Start Tessalon as needed for cough.  Flonase daily.  Lots of rest and fluids.  Please follow-up with your PCP if your symptoms do not improve.  Please go to the emergency room for any worsening symptoms.  Hope you feel better soon!    ED Prescriptions     Medication Sig Dispense Auth. Provider   benzonatate (TESSALON) 200 MG capsule Take 1 capsule (200 mg total) by mouth 3 (three) times daily as needed. 20 capsule Radford Pax, NP   fluticasone (FLONASE) 50 MCG/ACT nasal spray Place 1 spray into both nostrils daily. 15.8 mL Radford Pax, NP      PDMP not reviewed this encounter.   Radford Pax, NP 12/23/22 [phone]

## 2022-12-24 LAB — SARS CORONAVIRUS 2 (TAT 6-24 HRS): SARS Coronavirus 2: NEGATIVE

## 2023-05-24 ENCOUNTER — Ambulatory Visit: Payer: 59 | Admitting: Nurse Practitioner

## 2023-11-21 ENCOUNTER — Encounter: Payer: 59 | Admitting: Nurse Practitioner

## 2023-12-12 ENCOUNTER — Ambulatory Visit (INDEPENDENT_AMBULATORY_CARE_PROVIDER_SITE_OTHER): Payer: 59 | Admitting: Adult Health

## 2023-12-12 ENCOUNTER — Encounter: Payer: Self-pay | Admitting: Adult Health

## 2023-12-12 VITALS — BP 121/76 | HR 79 | Ht 61.0 in | Wt 312.6 lb

## 2023-12-12 DIAGNOSIS — G932 Benign intracranial hypertension: Secondary | ICD-10-CM | POA: Diagnosis not present

## 2023-12-12 DIAGNOSIS — Z5181 Encounter for therapeutic drug level monitoring: Secondary | ICD-10-CM | POA: Diagnosis not present

## 2023-12-12 MED ORDER — ACETAZOLAMIDE ER 500 MG PO CP12: 500.0000 mg | ORAL_CAPSULE | Freq: Two times a day (BID) | ORAL | 3 refills | Status: AC

## 2023-12-12 NOTE — Patient Instructions (Signed)
 Your Plan:  Continue Diamox  500 mg twice a day Please have your PCP send over your blood work results If your symptoms worsen or you develop new symptoms please let us  know.       Thank you for coming to see us  at [facility]. I hope we have been able to provide you high quality care today.  You may receive a patient satisfaction survey over the next few weeks. We would appreciate your feedback and comments so that we may continue to improve ourselves and the health of our patients.
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
